# Patient Record
Sex: Female | Born: 1960 | Race: Black or African American | Hispanic: No | Smoking: Current every day smoker
Health system: Southern US, Community
[De-identification: ages and names within clinical notes are randomized; demographics above are authoritative.]

## PROBLEM LIST (undated history)

## (undated) DIAGNOSIS — F329 Major depressive disorder, single episode, unspecified: Secondary | ICD-10-CM

## (undated) DIAGNOSIS — I10 Essential (primary) hypertension: Secondary | ICD-10-CM

## (undated) DIAGNOSIS — F32A Depression, unspecified: Secondary | ICD-10-CM

## (undated) DIAGNOSIS — I252 Old myocardial infarction: Secondary | ICD-10-CM

## (undated) DIAGNOSIS — E669 Obesity, unspecified: Secondary | ICD-10-CM

## (undated) DIAGNOSIS — I2510 Atherosclerotic heart disease of native coronary artery without angina pectoris: Secondary | ICD-10-CM

## (undated) DIAGNOSIS — F419 Anxiety disorder, unspecified: Secondary | ICD-10-CM

## (undated) HISTORY — PX: KNEE SURGERY: SHX244

## (undated) HISTORY — PX: CORONARY STENT PLACEMENT: SHX1402

## (undated) HISTORY — PX: OTHER SURGICAL HISTORY: SHX169

## (undated) HISTORY — PX: BREAST SURGERY: SHX581

---

## 2000-02-24 ENCOUNTER — Inpatient Hospital Stay (HOSPITAL_COMMUNITY): Admission: EM | Admit: 2000-02-24 | Discharge: 2000-02-25 | Payer: Self-pay | Admitting: Emergency Medicine

## 2000-04-08 ENCOUNTER — Emergency Department (HOSPITAL_COMMUNITY): Admission: EM | Admit: 2000-04-08 | Discharge: 2000-04-08 | Payer: Self-pay | Admitting: *Deleted

## 2000-11-19 HISTORY — PX: ABDOMINAL HYSTERECTOMY: SHX81

## 2001-01-13 ENCOUNTER — Encounter (INDEPENDENT_AMBULATORY_CARE_PROVIDER_SITE_OTHER): Payer: Self-pay | Admitting: Specialist

## 2001-01-13 ENCOUNTER — Other Ambulatory Visit: Admission: RE | Admit: 2001-01-13 | Discharge: 2001-01-13 | Payer: Self-pay | Admitting: Plastic Surgery

## 2001-03-18 ENCOUNTER — Encounter: Payer: Self-pay | Admitting: Emergency Medicine

## 2001-03-18 ENCOUNTER — Emergency Department (HOSPITAL_COMMUNITY): Admission: EM | Admit: 2001-03-18 | Discharge: 2001-03-18 | Payer: Self-pay | Admitting: Emergency Medicine

## 2002-02-18 ENCOUNTER — Encounter: Admission: RE | Admit: 2002-02-18 | Discharge: 2002-02-18 | Payer: Self-pay | Admitting: Occupational Medicine

## 2002-03-09 ENCOUNTER — Encounter: Admission: RE | Admit: 2002-03-09 | Discharge: 2002-03-18 | Payer: Self-pay | Admitting: Occupational Medicine

## 2002-10-16 ENCOUNTER — Emergency Department (HOSPITAL_COMMUNITY): Admission: EM | Admit: 2002-10-16 | Discharge: 2002-10-16 | Payer: Self-pay | Admitting: Emergency Medicine

## 2002-10-16 ENCOUNTER — Encounter: Payer: Self-pay | Admitting: Emergency Medicine

## 2002-10-29 ENCOUNTER — Ambulatory Visit (HOSPITAL_COMMUNITY): Admission: RE | Admit: 2002-10-29 | Discharge: 2002-10-29 | Payer: Self-pay | Admitting: Obstetrics

## 2002-10-29 ENCOUNTER — Encounter: Payer: Self-pay | Admitting: Obstetrics

## 2002-11-24 ENCOUNTER — Encounter (INDEPENDENT_AMBULATORY_CARE_PROVIDER_SITE_OTHER): Payer: Self-pay | Admitting: *Deleted

## 2002-11-24 ENCOUNTER — Inpatient Hospital Stay (HOSPITAL_COMMUNITY): Admission: RE | Admit: 2002-11-24 | Discharge: 2002-11-27 | Payer: Self-pay | Admitting: Obstetrics

## 2003-02-24 ENCOUNTER — Encounter: Payer: Self-pay | Admitting: Internal Medicine

## 2003-02-24 ENCOUNTER — Encounter: Admission: RE | Admit: 2003-02-24 | Discharge: 2003-02-24 | Payer: Self-pay | Admitting: Internal Medicine

## 2004-07-18 ENCOUNTER — Emergency Department (HOSPITAL_COMMUNITY): Admission: EM | Admit: 2004-07-18 | Discharge: 2004-07-18 | Payer: Self-pay | Admitting: Emergency Medicine

## 2005-10-02 ENCOUNTER — Emergency Department (HOSPITAL_COMMUNITY): Admission: EM | Admit: 2005-10-02 | Discharge: 2005-10-03 | Payer: Self-pay | Admitting: Emergency Medicine

## 2005-11-19 HISTORY — PX: CERVICAL LAMINECTOMY: SHX94

## 2006-07-22 ENCOUNTER — Inpatient Hospital Stay (HOSPITAL_COMMUNITY): Admission: EM | Admit: 2006-07-22 | Discharge: 2006-07-25 | Payer: Self-pay | Admitting: Emergency Medicine

## 2006-08-19 ENCOUNTER — Encounter: Admission: RE | Admit: 2006-08-19 | Discharge: 2006-08-19 | Payer: Self-pay | Admitting: Orthopedic Surgery

## 2006-08-26 ENCOUNTER — Emergency Department (HOSPITAL_COMMUNITY): Admission: EM | Admit: 2006-08-26 | Discharge: 2006-08-26 | Payer: Self-pay | Admitting: Emergency Medicine

## 2006-12-12 ENCOUNTER — Emergency Department (HOSPITAL_COMMUNITY): Admission: EM | Admit: 2006-12-12 | Discharge: 2006-12-12 | Payer: Self-pay | Admitting: Emergency Medicine

## 2007-10-24 ENCOUNTER — Emergency Department (HOSPITAL_COMMUNITY): Admission: EM | Admit: 2007-10-24 | Discharge: 2007-10-25 | Payer: Self-pay | Admitting: Emergency Medicine

## 2008-01-22 IMAGING — CR DG TIBIA/FIBULA 2V*L*
3 series · 3 of 3 positions shown · non-contrast
Comparison: 07/21/06 and 07/22/06.

CLINICAL DATA: Recent ORIF for tibial fracture.  Persistent pain left tib/fib. 
 TIBIA/FIBULA TWO VIEWS:

[view not recorded (1 of 3)]
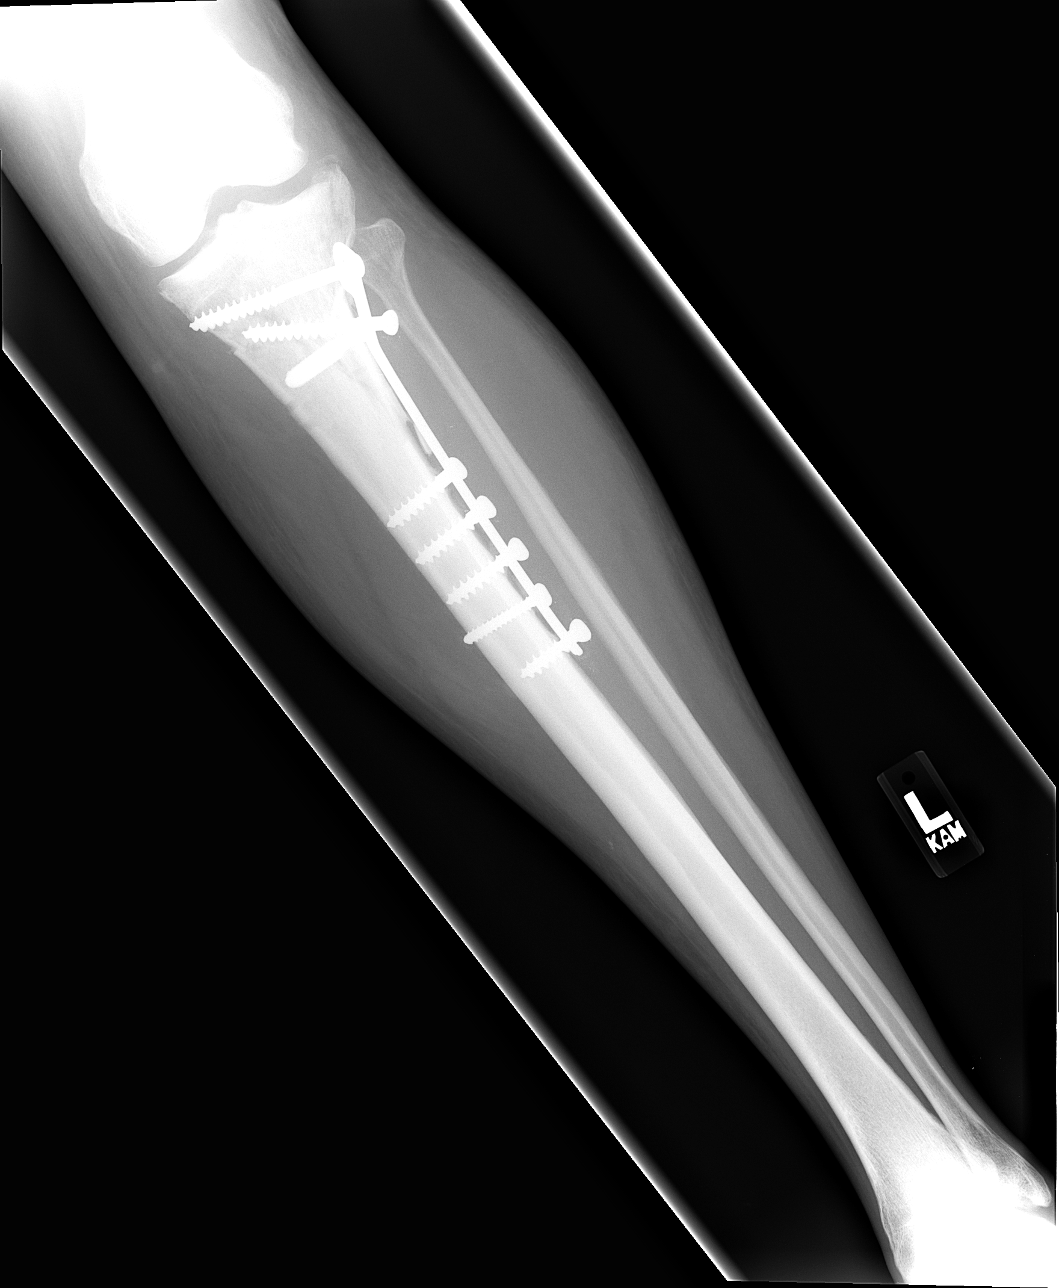

[view not recorded (2 of 3)]
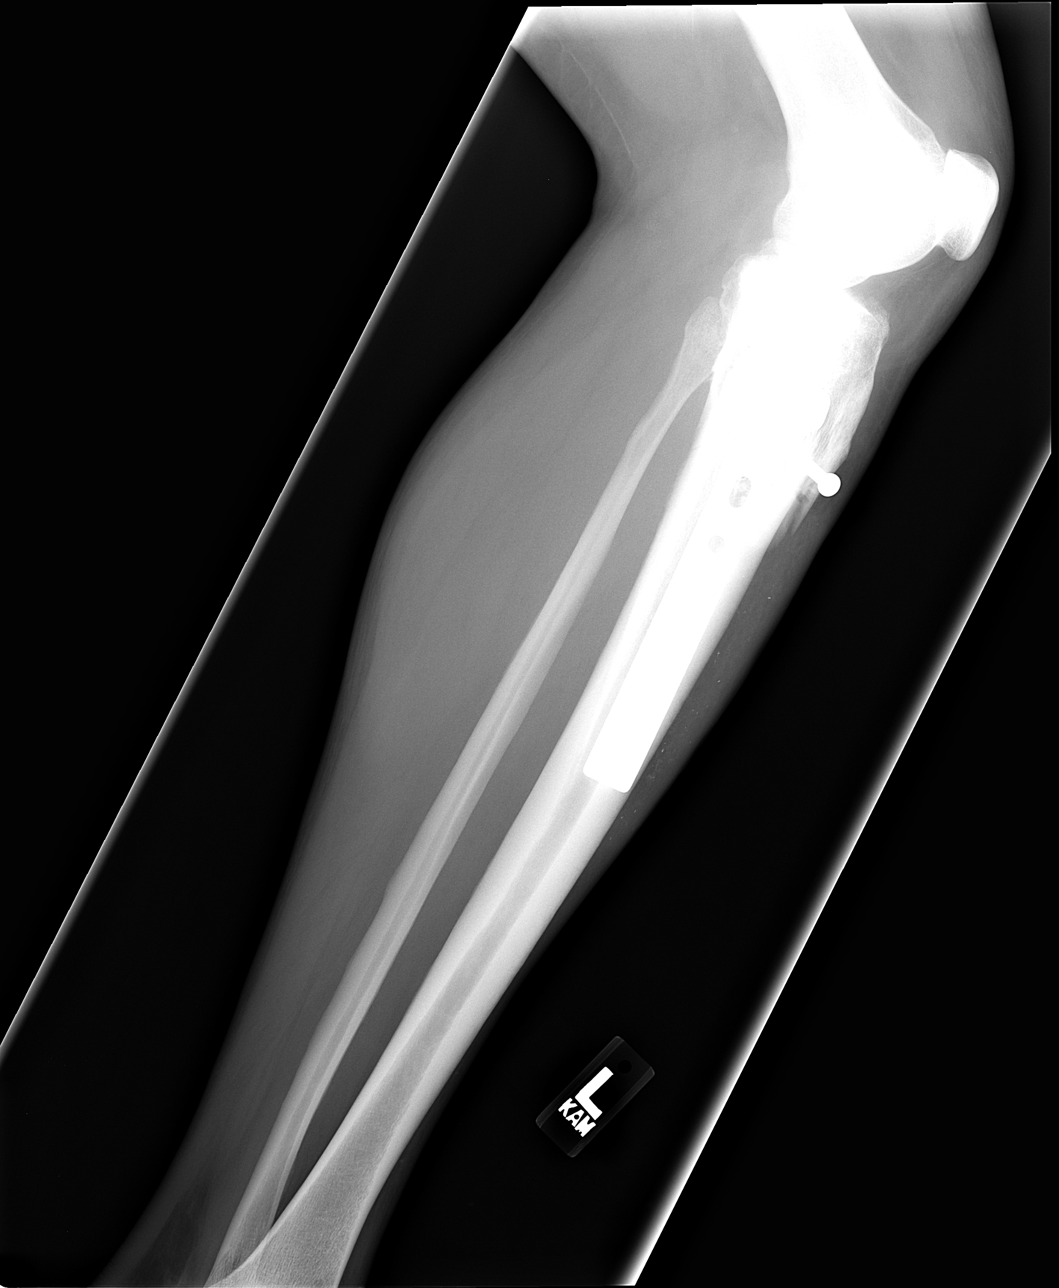

[view not recorded (3 of 3)]
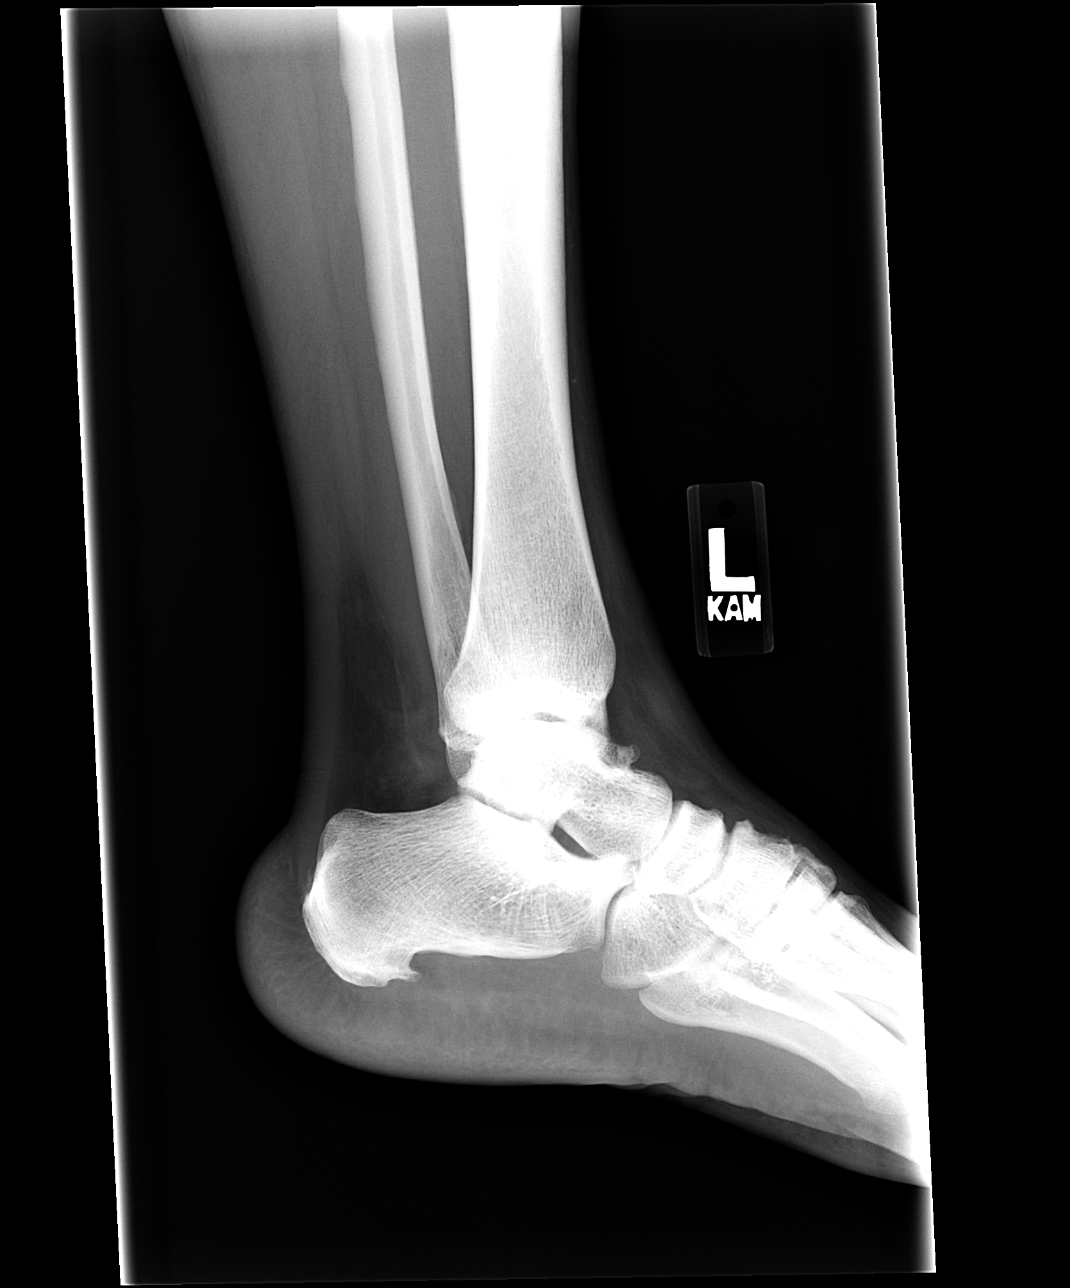

[3 of 3 positions shown; findings below may reference images not displayed]

FINDINGS: The patient has had ORIF with plate and screw fixation of a comminuted fracture of the proximal tibia and fibular head.  Overall position and alignment of the fracture fragments appears good.  Fracture line does extend to the lateral tibial plateau as demonstrated previously.
IMPRESSION: There is no radiographically detectable complication related to the surgical procedure.

## 2008-01-29 IMAGING — CR DG CHEST 2V
2 series · 2 of 2 positions shown · non-contrast
Comparison: 07/22/06

CLINICAL DATA: Cough, congestion.
 CHEST - 2 VIEW:

[w chest pa]
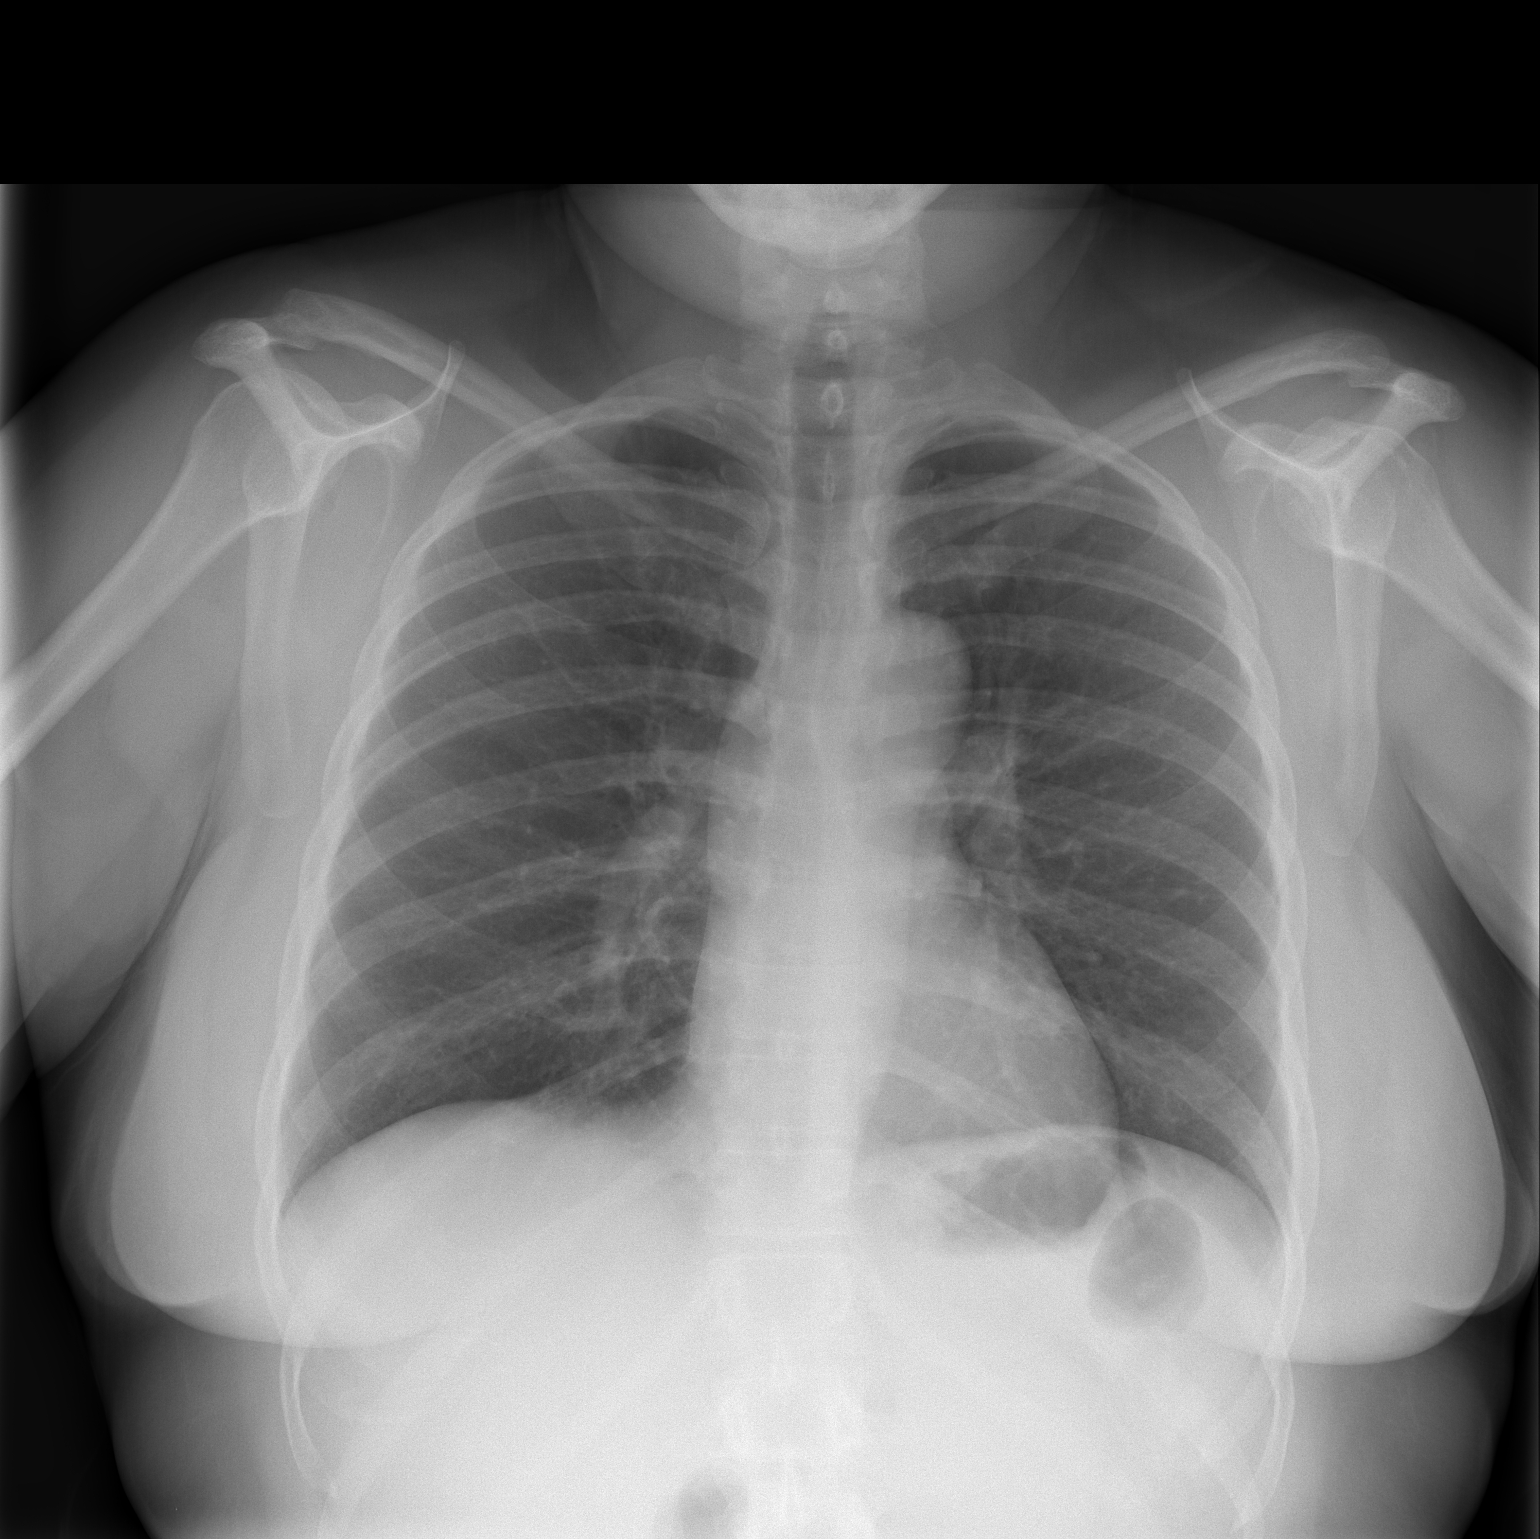

[w chest lat]
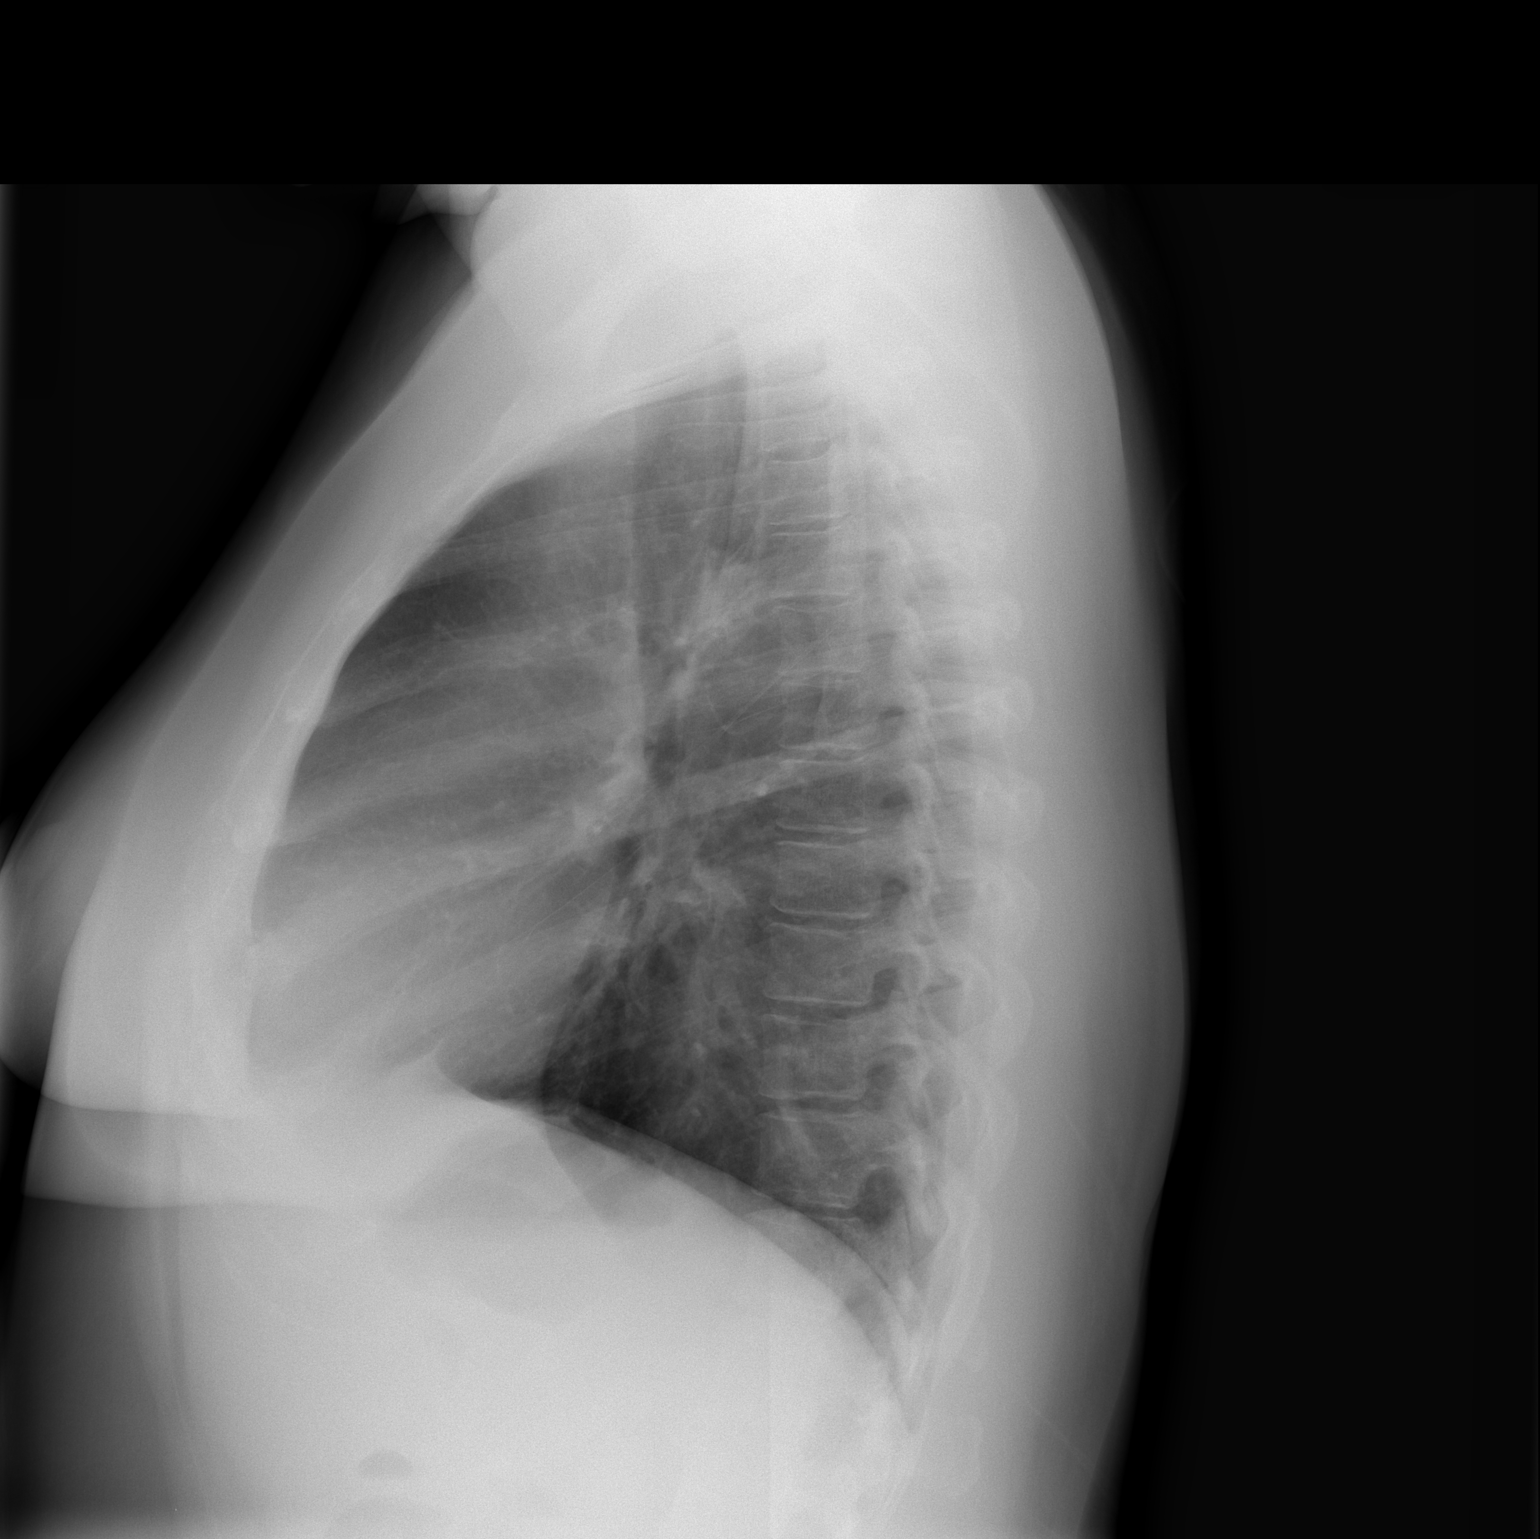

[2 of 2 positions shown; findings below may reference images not displayed]

FINDINGS: The heart size and mediastinal contours are within normal limits.  Both lungs are clear.  The visualized skeletal structures are unremarkable.
IMPRESSION: No active cardiopulmonary disease.

## 2008-10-11 ENCOUNTER — Ambulatory Visit (HOSPITAL_COMMUNITY): Admission: RE | Admit: 2008-10-11 | Discharge: 2008-10-12 | Payer: Self-pay | Admitting: Neurosurgery

## 2009-10-13 ENCOUNTER — Emergency Department (HOSPITAL_COMMUNITY): Admission: EM | Admit: 2009-10-13 | Discharge: 2009-10-13 | Payer: Self-pay | Admitting: Emergency Medicine

## 2009-11-01 ENCOUNTER — Emergency Department (HOSPITAL_COMMUNITY): Admission: EM | Admit: 2009-11-01 | Discharge: 2009-11-01 | Payer: Self-pay | Admitting: Emergency Medicine

## 2010-02-16 ENCOUNTER — Encounter: Admission: RE | Admit: 2010-02-16 | Discharge: 2010-02-16 | Payer: Self-pay | Admitting: Orthopedic Surgery

## 2010-03-15 ENCOUNTER — Ambulatory Visit (HOSPITAL_COMMUNITY): Admission: RE | Admit: 2010-03-15 | Discharge: 2010-03-15 | Payer: Self-pay | Admitting: Orthopedic Surgery

## 2010-04-14 ENCOUNTER — Encounter: Admission: RE | Admit: 2010-04-14 | Discharge: 2010-04-14 | Payer: Self-pay | Admitting: Orthopedic Surgery

## 2010-07-04 ENCOUNTER — Ambulatory Visit (HOSPITAL_COMMUNITY): Admission: RE | Admit: 2010-07-04 | Discharge: 2010-07-04 | Payer: Self-pay | Admitting: Orthopedic Surgery

## 2010-07-21 ENCOUNTER — Encounter: Admission: RE | Admit: 2010-07-21 | Discharge: 2010-08-09 | Payer: Self-pay | Admitting: Orthopedic Surgery

## 2010-12-10 ENCOUNTER — Encounter: Payer: Self-pay | Admitting: Obstetrics

## 2010-12-10 ENCOUNTER — Encounter: Payer: Self-pay | Admitting: Orthopedic Surgery

## 2011-02-01 LAB — URINALYSIS, ROUTINE W REFLEX MICROSCOPIC
Hgb urine dipstick: NEGATIVE
Leukocytes, UA: NEGATIVE
Nitrite: NEGATIVE
Protein, ur: NEGATIVE mg/dL
pH: 5.5 (ref 5.0–8.0)

## 2011-02-01 LAB — COMPREHENSIVE METABOLIC PANEL
Albumin: 3.8 g/dL (ref 3.5–5.2)
BUN: 16 mg/dL (ref 6–23)
CO2: 29 mEq/L (ref 19–32)
Chloride: 101 mEq/L (ref 96–112)
GFR calc Af Amer: >60 mL/min (ref >60)
Potassium: 4.7 mEq/L (ref 3.5–5.1)
Total Bilirubin: 0.3 mg/dL (ref 0.3–1.2)

## 2011-02-01 LAB — GLUCOSE, CAPILLARY
Glucose-Capillary: 218 mg/dL — ABNORMAL HIGH (ref 70–99)
Glucose-Capillary: 220 mg/dL — ABNORMAL HIGH (ref 70–99)

## 2011-02-01 LAB — CBC
HCT: 39.2 % (ref 36.0–46.0)
Hemoglobin: 13.3 g/dL (ref 12.0–15.0)
Platelets: 214 10*3/uL (ref 150–400)
RDW: 14 % (ref 11.5–15.5)
WBC: 6.1 10*3/uL (ref 4.0–10.5)

## 2011-02-06 LAB — COMPREHENSIVE METABOLIC PANEL
ALT: 12 U/L (ref 0–35)
AST: 20 U/L (ref 0–37)
Albumin: 4.1 g/dL (ref 3.5–5.2)
Alkaline Phosphatase: 77 U/L (ref 39–117)
BUN: 27 mg/dL — ABNORMAL HIGH (ref 6–23)
Calcium: 9.3 mg/dL (ref 8.4–10.5)
Chloride: 103 mEq/L (ref 96–112)
Creatinine, Ser: 1.07 mg/dL (ref 0.4–1.2)
GFR calc Af Amer: >60 mL/min (ref >60)
Glucose, Bld: 243 mg/dL — ABNORMAL HIGH (ref 70–99)
Total Protein: 6.8 g/dL (ref 6.0–8.3)

## 2011-02-06 LAB — GLUCOSE, CAPILLARY
Glucose-Capillary: 139 mg/dL — ABNORMAL HIGH (ref 70–99)
Glucose-Capillary: 143 mg/dL — ABNORMAL HIGH (ref 70–99)
Glucose-Capillary: 182 mg/dL — ABNORMAL HIGH (ref 70–99)

## 2011-02-06 LAB — CBC
Hemoglobin: 14.3 g/dL (ref 12.0–15.0)
MCHC: 35.6 g/dL (ref 30.0–36.0)
Platelets: 249 10*3/uL (ref 150–400)
RBC: 4.21 MIL/uL (ref 3.87–5.11)
RDW: 13.2 % (ref 11.5–15.5)

## 2011-02-06 LAB — URINALYSIS, ROUTINE W REFLEX MICROSCOPIC
Glucose, UA: 500 mg/dL — AB
Hgb urine dipstick: NEGATIVE
Nitrite: NEGATIVE
Urobilinogen, UA: 0.2 mg/dL (ref 0.0–1.0)

## 2011-02-06 LAB — URINE MICROSCOPIC-ADD ON

## 2011-04-02 ENCOUNTER — Emergency Department (HOSPITAL_COMMUNITY): Payer: Medicaid Other

## 2011-04-02 ENCOUNTER — Emergency Department (HOSPITAL_COMMUNITY)
Admission: EM | Admit: 2011-04-02 | Discharge: 2011-04-02 | Disposition: A | Discharge status: Home / Self Care | Payer: Medicaid Other | Attending: Emergency Medicine | Admitting: Emergency Medicine

## 2011-04-02 DIAGNOSIS — M25519 Pain in unspecified shoulder: Secondary | ICD-10-CM | POA: Insufficient documentation

## 2011-04-02 DIAGNOSIS — I10 Essential (primary) hypertension: Secondary | ICD-10-CM | POA: Insufficient documentation

## 2011-04-02 DIAGNOSIS — Y92009 Unspecified place in unspecified non-institutional (private) residence as the place of occurrence of the external cause: Secondary | ICD-10-CM | POA: Insufficient documentation

## 2011-04-02 DIAGNOSIS — E119 Type 2 diabetes mellitus without complications: Secondary | ICD-10-CM | POA: Insufficient documentation

## 2011-04-02 DIAGNOSIS — X500XXA Overexertion from strenuous movement or load, initial encounter: Secondary | ICD-10-CM | POA: Insufficient documentation

## 2011-04-03 NOTE — Op Note (Signed)
NAMEMADDUX, FIRST              ACCOUNT NO.:  [id number]   MEDICAL RECORD NO.:  [id number]          PATIENT TYPE:  OIB   LOCATION:  3533                         FACILITY:  [facility]   PHYSICIAN:  Clydene Fake, M.D.  DATE OF BIRTH:  [birth year]   DATE OF PROCEDURE:  10/11/2008  DATE OF DISCHARGE:                               OPERATIVE REPORT   DIAGNOSES:  Herniated nucleus pulposus at C4-C5, spondylosis, and cord  compression, and myelopathy.   POSTOPERATIVE DIAGNOSIS:  Herniated nucleus pulposus at C4-C5,  spondylosis, and cord compression, and myelopathy.   PROCEDURE:  Anterior cervical decompression, diskectomy, and fusion at  C4-C5 with LifeNet allograft bone and Trestle anterior cervical plate.   SURGEON:  Clydene Fake, MD.   ASSISTANT:  Stefani Dama, MD   ANESTHESIA:  General endotracheal tube anesthesia.   ESTIMATED BLOOD LOSS:  Minimal.   BLOOD GIVEN:  None.   DRAINS:  None.   COMPLICATIONS:  None.   REASON FOR PROCEDURE:  The patient is a 50 year old woman who has been  having neck and right arm pain and numbness with some right-sided  clumsiness on exam and have decreased sensation at right C6  distribution, which has slowed her up to have movements in the right and  MRI was done showing disk herniation, spinal changes central to the  right and to C4-C5 causing compression of spinal cord and some cord  change within the cord and foraminal narrowing to the right side.  The  patient was brought in for decompression and fusion.   PROCEDURE IN DETAIL:  The patient was brought to the operating room.  General anesthesia was induced.  The patient was placed in 10 pounds of  halter traction and prepped and draped in a sterile fashion.  A standard  incision was injected with 10 mL of 1% lidocaine with epinephrine.  An  incision was then made from the midline to the anterior border of the  sternocleidomastoid muscle on left side of the neck and incision was  taken down the left platysma and hemostasis was obtained with Bovie  cauterization.  The platysma was incised with Bovie and then blunt  dissection was taken through the anterior cervical fascia of the  anterior cervical spine.  Needle was placed in the interspace.  X-ray  was obtained showing this to be at C4-C5 interspace.  Needle was  removed.  The disk space was incised with a 15 blade and partial  diskectomy was performed.  Anterior osteophytes were removed.  Longus  coli muscles were reflected laterally on each side using the Bovie and  self-retaining retractors were placed.  Disk space was again incised  with a 15 blade and diskectomy was continued with pituitary rongeurs and  curettes.  Anterior osteophytes were removed with Kerrison punches.  Distraction pins were placed in the C4 and C5.  The interspace was  distracted and diskectomy was continued with pituitary rongeurs and  curettes.  Microscope was brought in for microdissection and 1 and 2 mm  Kerrison punches were used to remove the posterior disk osteophytes and  ligament decompressing  the central canal, spinal cord and performed  bilateral foraminotomies.  When we were finished, we had good  decompression of central canal and bilateral nerve roots.  High-speed  drill was used to remove cartilaginous endplate.  The wound was  irrigated with antibiotic solution.  We had good hemostasis.  We  measured the height of disk space to be 5 mm and a 5-mm LifeNet  allograft bone was tapped into place.  The distraction pins were  removed.  Hemostasis was obtained with Gelfoam and thrombin.  Weight was  removed the traction.  The bone plug was firmly in place and a Trestle  anterior cervical plate was placed over the anterior cervical spine and  2 screws were placed in the C4 and 2 in the C5.  These were tightened  down.  Lateral x-rays were obtained showing good position of plates,  screw, and bone plug at the C4C5 level.  Retractors  were removed.  We  had good hemostasis.  We irrigated with antibiotic solution and the  platysma was closed with 3-0 Vicryl interrupted sutures.  Subcutaneous  tissue was closed with same.  Skin was closed with benzoin and Steri-  Strips.  Dressing was placed.  The patient was placed in a soft  cervical, awakened from anesthesia, and transferred to the recovery room  in stable condition.           ______________________________  Clydene Fake, M.D.     JRH/MEDQ  D:  10/11/2008  T:  10/12/2008  Job:  [id number]

## 2011-04-06 NOTE — Discharge Summary (Signed)
[place]  Patient:    Debbie [name], Debbie [name]                 MRN: [id number] Adm. Date:  02/24/00 Disc. Date: 02/25/00 Attending:  Alvester Morin, M.D. Dictator:   Nolon Nations, M.D.                           Discharge Summary  DISCHARGE DIAGNOSES: 1. Abscess/cellulitis. 2. Diabetes mellitus.  CONSULTS:  None.  PROCEDURES:  Abscess incision and drainage.  HOSPITAL COURSE: #1 - ABSCESS/CELLUITIS:  The patient had the abscess incised and drained by the EDP first and then by Jannette Fogo, M.D., secondly.  There was expression of pus and there was irrigation with normal saline.  Surgery evaluated the patient and recommended Unasyn, overnight follow-up, and consider discharge in the morning with Augmentin.  The plan was to watch for fevers.  The patient was afebrile and did have good resolution of symptoms overnight.  She was discharged with follow-up with [facility]. Daphine Deutscher, M.D., the week of discharge with 10 days of Augmentin and follow-up with Nolon Nations, M.D., in three weeks for general care.  #2 - DIABETES:  The patient had received Glucotrol in house.  Hemoglobin A1C, fasting lipids, and CBGs were checked.  The hemoglobin A1C revealed a value of 11.3.  Her cholesterol panel showed total cholesterol 156, LDL 109, triglycerides 199, and HDL 7.  DISCHARGE MEDICATIONS: 1. Augmentin 875 mg p.o. b.i.d. x 10 days. 2. Glucotrol XL and Glucophage as previous. 3. Multivitamins one tablet p.o. q.d.  FOLLOW-UP: 1. [facility] Daphine Deutscher, M.D., general surgery.  The patient is to call for a    hospital follow-up on Monday following discharge. 2. Nolon Nations, M.D., of family practice.  To call for hospital    follow-up within three weeks.  DISCHARGE CONDITION:  Good. DD:  03/14/01 TD:  03/17/01 Job: [id number]

## 2011-04-06 NOTE — Op Note (Signed)
NAME:  Debbie [name], Debbie [name]                        ACCOUNT NO.:  [id number]   MEDICAL RECORD NO.:  [id number]                   PATIENT TYPE:  INP   LOCATION:  9323                                 FACILITY:  [facility]   PHYSICIAN:  Kathreen Cosier, M.D.           DATE OF BIRTH:  April 28, 1961   DATE OF PROCEDURE:  11/24/2002  DATE OF DISCHARGE:                                 OPERATIVE REPORT   PREOPERATIVE DIAGNOSES:  1. Myoma uteri.  2. Dysfunctional uterine bleeding.   SURGEON:  Kathreen Cosier, M.D.   FIRST ASSISTANT:  Charles A. Clearance Coots, M.D.   PROCEDURE:  Total abdominal hysterectomy and bilateral salpingectomy.   DESCRIPTION OF PROCEDURE:  The patient was placed on the operating table in  the supine position.  The abdomen was prepped and draped.  Bladder emptied  with Foley catheter.  Transverse suprapubic incision was made and carried  down to the rectus fascia.  Fascia cleaned and incised the length of the  incision.  Recti muscles were retracted laterally.  Peritoneum was entered  longitudinally.  Uterus was enlarged with multiple myomas.  Ovaries were  normal. Tubes normal.  Right round ligament was grasped with Kelly clamp,  cut and suture ligated with #1 chromic.  The procedure was done in a similar  fashion on the other side.  Using the Metzenbaum scissors, the bladder flap  was developed and dissected free of the cervix.  The right infundibulopelvic  ligament grasped with a Kelly clamp, cut and suture ligated with #1 chromic.  The procedure was done in a similar fashion on the other side.  Uterine  vessels were skeletonized bilaterally, double clamped with Heaney clamps on  the right, cut and suture ligated and done in similar fashion on the other  side.  The uterus was enlarged and was transected off of the cervix at this  point.  The cervix was grasped with Kochers and using straight Kocher clamp,  cardinal uterosacral ligaments grasped on the right, cut and suture  ligated  with #1 chromic.  The procedure weakness done in similar fashion on the  other side.  Specimen consisted of cervix removal at cervicovaginal junction  with Mayo scissors.  Modified Richardson sutures placed in the angles of the  vagina then the vaginal vault run with interlocking suture or #1 chromic.  Hemostasis was satisfactory. The right tube was grasped with Kelly clamp,  cut and suture ligated with #1 chromic.  The procedure was done in a similar  fashion on the other side.  The operative site was reperitonealized with 2-0  chromic.  Blood loss was 200 cc.  Lap and sponge counts were correct.  Abdomen closed in layers, peritoneum with continuous suture of 0 chromic,  fascia continuous suture of 0 Dexon and the skin closed with subcuticular  stitch of 3-0 Monocryl.  Kathreen Cosier, M.D.    BAM/MEDQ  D:  11/24/2002  T:  11/24/2002  Job:  [id number]

## 2011-04-06 NOTE — Op Note (Signed)
NAMELOUISE, Debbie [name]              ACCOUNT NO.:  [id number]   MEDICAL RECORD NO.:  [id number]          PATIENT TYPE:  INP   LOCATION:  1617                         FACILITY:  [facility]   PHYSICIAN:  Myrtie Neither, MD      DATE OF BIRTH:  02/05/1961   DATE OF PROCEDURE:  07/22/2006  DATE OF DISCHARGE:                                 OPERATIVE REPORT   PREOPERATIVE DIAGNOSIS:  Comminuted proximal tibial plateau and proximal  tibial fracture.   POSTOP DIAGNOSIS.:  Comminuted proximal tibial plateau and proximal tibial  fracture.   ANESTHESIA:  General.   PROCEDURE:  Open reduction internal fixation left proximal tibial fracture  with intra-articular involvement.   DESCRIPTION OF PROCEDURE:  The patient taken to operating room after given  adequate preop medications.  She was given general anesthesia and intubated.  Left lower extremity was prepped with DuraPrep and draped in sterile manner.  Tourniquet Bovie used for hemostasis.  C-arm was used to visualize fracture  reduction.  Anterior proximal curvilinear incision made along the proximal  tibial area going through the skin and subcutaneous tissue.  Sharp and blunt  dissection was made down through the fascia separating the soft tissue  subperiosteally from the fracture site. Manipulated reduction of the  fracture was done.  Two K-wires were initially used to last hold the  comminuted fragments in place.  Separate anterior fragment which was  displaced anteriorly.  This was stabilized with K-wire.  After reduction of  the fracture site an 8-hole T-plate buttress plate was used with the use of  six screws stabilizing.  Reduction was very good and was stable fixation.  Copious irrigation was done. A separate segmental screw was used anteriorly  where the anterior tibial tuberosity fragment had split off.  Further  irrigation was done. 0 Vicryl used for the fascia, 2-0 for the subcutaneous  and skin staples for skin.  Bulky compressive  dressing was applied.  Knee  immobilizer applied.  The patient tolerated the procedure quite well and  went to recovery room in stable and satisfactory position.      Myrtie Neither, MD  Electronically Signed     AC/MEDQ  D:  07/22/2006  T:  07/22/2006  Job:  [id number]

## 2011-04-06 NOTE — Discharge Summary (Signed)
Debbie [name], Debbie [name]              ACCOUNT NO.:  [id number]   MEDICAL RECORD NO.:  [id number]          PATIENT TYPE:  INP   LOCATION:  1617                         [facility]ACILITY:  [facility]   PHYSICIAN:  Myrtie Neither, MD      DATE OF BIRTH:  1961/11/09   DATE OF ADMISSION:  07/21/2006  DATE OF DISCHARGE:  07/25/2006                                 DISCHARGE SUMMARY   ADMISSION DIAGNOSES:  1. Comminuted proximal tibial fracture, left lower extremity.  2. History of diabetes mellitus.  3. History of hypertension.  4. History of asthma.   DISCHARGE DIAGNOSES:  1. Comminuted proximal tibial fracture, left lower extremity.  2. History of diabetes mellitus.  3. History of hypertension.  4. History of asthma.   COMPLICATIONS:  None.  No infections.   PROCEDURE:  Open reduction internal fixation of left proximal tibia.   HISTORY OF PRESENT ILLNESS:  This is a 50 year old female who states that  she was lifting her grandchild from the stroller when she had sat the child  down, lost her footing and landed her left knee down against the concrete  curb.  The patient was brought to [facility] Emergency Room for treatment.   PHYSICAL EXAMINATION:  Pertinent physical was down to the left lower  extremity plus effusion, tender medial and laterally and anteriorly.  Calf  is soft.  Negative Homans test.  Pulses intact.  X-rays revealed comminuted  proximal tibial fracture.   HOSPITAL COURSE:  The patient had preop laboratories with CBC, UA, EKG,  chest x-ray and CMET.  The patient's laboratory tests were stable enough to  undergo surgery.  The patient underwent ORIF of the proximal left tibia.  She tolerated the procedure quite well.  Postoperative course was fairly  benign and started on IV antibiotics and pain medications, physical therapy  nonweightbearing on the left side with use of knee immobilizer, ice packs  and elevation.  The patient's pain was brought under control.  The patient  progressed quite well to the point of being able to be discharged afebrile.  Pain and swelling was under controlled.   DISCHARGE MEDICATIONS:  Percocet one to two q.4h. p.r.n. pain.   ACTIVITY:  Elevation with ice packs.  Nonweightbearing on the left side with  use of crutches.   FOLLOW UP:  Return to the office in 1 week.   CONDITION ON DISCHARGE:  The patient is discharged in stable and  satisfactory condition.      Myrtie Neither, MD  Electronically Signed     AC/MEDQ  D:  08/02/2006  T:  08/03/2006  Job:  [id number]

## 2011-04-06 NOTE — Discharge Summary (Signed)
   NAME:  Debbie [name], Debbie [name]                        ACCOUNT NO.:  [id number]   MEDICAL RECORD NO.:  [id number]                   PATIENT TYPE:  INP   LOCATION:  9323                                 FACILITY:  [facility]   PHYSICIAN:  Kathreen Cosier, M.D.           DATE OF BIRTH:  [birth year]   DATE OF ADMISSION:  11/24/2002  DATE OF DISCHARGE:  11/27/2002                                 DISCHARGE SUMMARY   SUMMARY:  The patient is a 50 year old gravida 3 para 3 with abnormal  bleeding for the past year and myoma uteri, 16 weeks size.  She was brought  in for total abdominal hysterectomy.  On admission her hemoglobin was 8.1;  postoperative 7.1.  Platelets were 348 and 245.  PT/PTT normal.  Sodium 138,  potassium 4.3, chloride 107, creatinine 0.7, BUN 13.  Her glucose was 138 on  admission.  The patient has a history of diabetes and she takes Actos 13 mg  daily.  Postoperatively the patient did well and was discharged home on  postoperative day #3, ambulatory, on a regular diet, and Tylox one q.3-4h.,  to see me in four weeks.   DISCHARGE DIAGNOSES:  Status post total abdominal hysterectomy for myoma  uteri and dysfunctional uterine bleeding.                                               Kathreen Cosier, M.D.    BAM/MEDQ  D:  11/27/2002  T:  11/28/2002  Job:  [id number]

## 2011-04-06 NOTE — H&P (Signed)
Debbie [name], Debbie [name]              ACCOUNT NO.:  [id number]   MEDICAL RECORD NO.:  [id number]          PATIENT TYPE:  INP   LOCATION:  0103                         FACILITY:  [facility]   PHYSICIAN:  Myrtie Neither, MD      DATE OF BIRTH:  May 12, 1961   DATE OF ADMISSION:  07/21/2006  DATE OF DISCHARGE:                                HISTORY & PHYSICAL   CHIEF COMPLAINT:  Painful, swollen left knee.   HISTORY OF PRESENT ILLNESS:  This is a 50 year old black female who had a  fall today landing onto a curve while she was lifting her grandchild.  The  patient landed on the left knee and developed severe pain, swelling, and  deformity of the left knee.  The patient was brought to [facility] emergency room for treatment.  The patient denies any  trauma to the head, but does state that she bruised the left elbow also.   PAST MEDICAL HISTORY:  Diabetes mellitus, high blood pressure, and  depression.  Previous history of right knee surgery.   ALLERGIES:  No known drug allergies.   MEDICATIONS:  1. Xanax.  2. Antidepressant.  3. Antihypertensive.  4. Diabetic oral medication which she does not know and does not have with      her.   FAMILY HISTORY:  High blood pressure, diabetes in both parents as well as  cancer history.   SOCIAL HISTORY:  The patient does smoke one pack per day and occasional use  of alcohol.   REVIEW OF SYSTEMS:  No cardiac or respiratory and no urinary or bowel  symptoms.   PHYSICAL EXAMINATION:  VITAL SIGNS:  Temperature 98.1, blood pressure  183/94, pulse 88, respirations 22.  HEENT:  Head normocephalic.  Eyes; conjunctivae and sclerae clear.  NECK:  Supple.  CHEST:  Clear.  HEART:  S1 and S2 regular.  ABDOMEN:  Soft, active bowel sounds.  EXTREMITIES:  Left knee +2 effusion, tender medially, laterally, and  anteriorly.  Calf muscle is soft.  Negative Homan's test.  Pulses in the  left lower extremity is intact.  The patient did have  movement of the toes.  Sensory is intact.   X-ray reveal a comminuted proximal tibial intra-articular fracture.   IMPRESSION:  Severely comminuted proximal tibial intra-articular fracture,  left knee.   PLAN:  Open reduction and internal fixation of proximal tibial fracture.      Myrtie Neither, MD  Electronically Signed     AC/MEDQ  D:  07/22/2006  T:  07/22/2006  Job:  [id number]

## 2011-06-11 ENCOUNTER — Ambulatory Visit: Payer: Medicare Other | Admitting: Physical Medicine & Rehabilitation

## 2011-06-11 ENCOUNTER — Encounter: Payer: Medicare Other | Attending: Physical Medicine & Rehabilitation

## 2011-06-11 DIAGNOSIS — M545 Low back pain, unspecified: Secondary | ICD-10-CM | POA: Insufficient documentation

## 2011-06-11 DIAGNOSIS — R209 Unspecified disturbances of skin sensation: Secondary | ICD-10-CM | POA: Insufficient documentation

## 2011-06-11 DIAGNOSIS — M5126 Other intervertebral disc displacement, lumbar region: Secondary | ICD-10-CM | POA: Insufficient documentation

## 2011-06-11 DIAGNOSIS — M961 Postlaminectomy syndrome, not elsewhere classified: Secondary | ICD-10-CM | POA: Insufficient documentation

## 2011-06-11 DIAGNOSIS — M47817 Spondylosis without myelopathy or radiculopathy, lumbosacral region: Secondary | ICD-10-CM

## 2011-06-11 DIAGNOSIS — M542 Cervicalgia: Secondary | ICD-10-CM | POA: Insufficient documentation

## 2011-06-11 DIAGNOSIS — M4712 Other spondylosis with myelopathy, cervical region: Secondary | ICD-10-CM | POA: Insufficient documentation

## 2011-06-11 DIAGNOSIS — M4802 Spinal stenosis, cervical region: Secondary | ICD-10-CM | POA: Insufficient documentation

## 2011-06-11 NOTE — Progress Notes (Signed)
CHIEF COMPLAINT:  Back pain.  A 50 year old female with history of cervical myelopathy and stenosis has had previous surgery per Dr. Colon Branch in 2009, who performed ACDF at C4-5 level.  In addition, she has had problems with what sounds to be recurrent shoulder dislocation and in the last time she had a "a pinched nerve related to this."  She has been seen by Dr. Myrtie Neither as well.  Her primary care physician is Dr. Greggory Stallion Osei-Bonsu.  She has had some lower extremity pain.  She has had ankle brachial indexes performed which was 0.81 on the right and 0.91 on the left.  She has had chronic low back pain.  Her last MRI of the lumbar spine was in 2004, demonstrating L4-5 stenosis graded as moderate centrally, but no foraminal stenosis.  She had facet arthrosis bilaterally L4-5.  She had disk protrusion at L5-S1 and no significant central stenosis at that level.  She has had a history of a complex tibial fracture, status post ORIF several years ago and has more recently approximately 1 year ago had removal of hardware per Dr. Rossie Muskrat.  Her medications include lisinopril/hydrochlorothiazide, Norvasc, Pravachol, Celexa, Metaglip, Klonopin, hydrocodone which she takes 10 mg approximately 3 times a day, fluticasone, etodolac and metaxalone.  HABITS:  No smoking.  No alcohol abuse.  She "smokes some reefer or puts it in the tea bag and drinks it."  She has been on Neurontin in the past.  OTHER PAST HISTORY:  She is diabetic, has some numbness in the feet.  REVIEW OF SYSTEMS:  Otherwise negative.  Her pain scores are in the moderate-to-severe range around 8/10.  She is functionally independent.  PHYSICAL EXAMINATION:  In general, no acute distress.  Mood and affect appropriate.  Her neck range of motion is 75% forward flexion, extension, lateral rotation and bending.  Upper extremity strength is 5/5 bilateral deltoid, biceps, triceps grip as well as lower extremity hip  flexor, knee extensor and ankle dorsiflexor.  Sensation is mildly reduced to left thumb compared to the right side.  She has no hand intrinsic atrophy.  In the lower extremities, she has a decrease in pinprick sensation in a stocking pattern.  Her gait shows no evidence of toe drag or knee instability.  She has negative Tinel at the elbows and wrists bilaterally.  Range of motion in the upper and lower extremities are normal.  Lumbar range of motion, however, is about 50% forward flexion, extension, lateral rotation and bending.  She states that both forward flexion and extension heard equally.  IMPRESSION: 1. 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 2. Low back pain, has a history of lumbar spinal stenosis seen graded     as moderate in 2004 likely progressive.  She does have lumbar     spondylosis as well that maybe accounting for some of her pain. 3. Left thumb numbness.  This can be due to brachial plexopathy from     dislocation, but other potential etiologies exist such as cervical     radiculopathy or even a peripheral compression neuropathy.  PLAN: 1. We will get her set up for medial branch blocks. 2. We discussed narcotic analgesics and that it THC shows up on her     urine drug screen.  I would not be prescribing these.  She is okay     with this per her report. 3. To start Neurontin 100 mg working up to q.i.d. over the course of  the next 2 weeks. 4. We went over spine model with the patient discussing on medial     branch blocks, answered questions.     Erick Colace, M.D. Electronically Signed    AEK/MedQ D:  06/11/2011 11:35:09  T:  06/11/2011 23:02:00  Job #:  [id number]  cc:   Jackie Plum, M.D. Fax: [phone]

## 2011-07-09 ENCOUNTER — Encounter: Payer: Medicare Other | Attending: Physical Medicine & Rehabilitation

## 2011-07-09 ENCOUNTER — Ambulatory Visit (HOSPITAL_BASED_OUTPATIENT_CLINIC_OR_DEPARTMENT_OTHER)
Admission: RE | Admit: 2011-07-09 | Discharge: 2011-07-09 | Disposition: A | Discharge status: Home / Self Care | Payer: Medicare Other | Source: Ambulatory Visit | Attending: Physical Medicine & Rehabilitation | Admitting: Physical Medicine & Rehabilitation

## 2011-07-09 ENCOUNTER — Encounter: Payer: Medicare Other | Admitting: Physical Medicine & Rehabilitation

## 2011-07-09 DIAGNOSIS — M47817 Spondylosis without myelopathy or radiculopathy, lumbosacral region: Secondary | ICD-10-CM

## 2011-07-09 DIAGNOSIS — M4712 Other spondylosis with myelopathy, cervical region: Secondary | ICD-10-CM | POA: Insufficient documentation

## 2011-07-09 DIAGNOSIS — M545 Low back pain, unspecified: Secondary | ICD-10-CM | POA: Insufficient documentation

## 2011-07-09 DIAGNOSIS — R209 Unspecified disturbances of skin sensation: Secondary | ICD-10-CM | POA: Insufficient documentation

## 2011-07-09 DIAGNOSIS — M4802 Spinal stenosis, cervical region: Secondary | ICD-10-CM | POA: Insufficient documentation

## 2011-07-09 DIAGNOSIS — M961 Postlaminectomy syndrome, not elsewhere classified: Secondary | ICD-10-CM | POA: Insufficient documentation

## 2011-07-09 DIAGNOSIS — M5126 Other intervertebral disc displacement, lumbar region: Secondary | ICD-10-CM | POA: Insufficient documentation

## 2011-07-09 DIAGNOSIS — M542 Cervicalgia: Secondary | ICD-10-CM | POA: Insufficient documentation

## 2011-07-09 NOTE — Procedures (Signed)
[name], [name]              ACCOUNT NO.:  [id number]  MEDICAL RECORD NO.:  [id number]           PATIENT TYPE:  O  LOCATION:  [facility]                          FACILITY:  [facility]  PHYSICIAN:  Erick Colace, M.D.DATE OF BIRTH:  04-23-1961  DATE OF PROCEDURE:  07/09/2011 DATE OF DISCHARGE:                              OPERATIVE REPORT  PROCEDURE:  Bilateral L5 dorsal ramus injection, bilateral L4 and bilateral L3 medial branch blocks under fluoroscopic guidance.  INDICATION:  Lumbar pain, lumbar spondylosis.  Informed consent was obtained after describing risks and benefits of the procedure with the patient.  These include bleeding, bruising and infection.  She elects to proceed and has given written consent.  The patient placed prone on fluoroscopy table.  Betadine prep, sterile drape, time-out taken to ensure proper procedure.  It is bilateral.  A 25-gauge inch and half needle was used to anesthetize the skin and subcu tissue 1% lidocaine 1 mL at each of 6 sites and a 22-gauge, 3-1/2- inch spinal needle was inserted under fluoroscopic guidance starting first left S1 SAP sacroiliac junction, bone contact made.  Omnipaque 180 x 0.5 mL demonstrated no intravascular uptake.  Then, 0.5 mL of a solution containing 1 mL of 4 mg/mL of dexamethasone, 2 mL of 2% MPF lidocaine was injected in the left L5 SAP transverse process junction targeted, bone contact made.  Omnipaque 180 x 0.5 mL demonstrated no intravascular uptake and 0.5 mL of the dexamethasone lidocaine solution was injected.  Then, a left L4 SAP transverse process junction targeted, bone contact made.  Omnipaque 180 x 0.5 mL demonstrated no intravascular uptake.  Then, 0.5 mL of the dexamethasone lidocaine solution was injected.  The same procedure was repeated on the right side using same needle injectate and technique at corresponding levels.  The patient tolerated the procedure well.  Postprocedure instructions  given.     Erick Colace, M.D. Electronically Signed    AEK/MEDQ  D:  07/09/2011 14:34:07  T:  07/09/2011 22:56:01  Job:  [id number]  cc:   Jackie Plum, M.D. Fax: [phone]

## 2011-08-20 ENCOUNTER — Ambulatory Visit: Payer: Medicare Other | Admitting: Physical Medicine & Rehabilitation

## 2011-08-20 ENCOUNTER — Encounter: Payer: Medicare Other | Attending: Physical Medicine & Rehabilitation

## 2011-08-20 DIAGNOSIS — M961 Postlaminectomy syndrome, not elsewhere classified: Secondary | ICD-10-CM | POA: Insufficient documentation

## 2011-08-20 DIAGNOSIS — M542 Cervicalgia: Secondary | ICD-10-CM | POA: Insufficient documentation

## 2011-08-20 DIAGNOSIS — M4802 Spinal stenosis, cervical region: Secondary | ICD-10-CM | POA: Insufficient documentation

## 2011-08-20 DIAGNOSIS — R209 Unspecified disturbances of skin sensation: Secondary | ICD-10-CM | POA: Insufficient documentation

## 2011-08-20 DIAGNOSIS — M5126 Other intervertebral disc displacement, lumbar region: Secondary | ICD-10-CM | POA: Insufficient documentation

## 2011-08-20 DIAGNOSIS — M545 Low back pain, unspecified: Secondary | ICD-10-CM | POA: Insufficient documentation

## 2011-08-20 DIAGNOSIS — M4712 Other spondylosis with myelopathy, cervical region: Secondary | ICD-10-CM | POA: Insufficient documentation

## 2011-08-21 LAB — BASIC METABOLIC PANEL
BUN: 12
CO2: 24
Calcium: 8.8
GFR calc non Af Amer: >60
Glucose, Bld: 181 — ABNORMAL HIGH

## 2011-08-21 LAB — GLUCOSE, CAPILLARY
Glucose-Capillary: 176 — ABNORMAL HIGH
Glucose-Capillary: 202 — ABNORMAL HIGH
Glucose-Capillary: 218 — ABNORMAL HIGH
Glucose-Capillary: 275 — ABNORMAL HIGH
Glucose-Capillary: 279 — ABNORMAL HIGH

## 2011-08-21 LAB — CBC
MCHC: 33.8
Platelets: 227
RDW: 12.9

## 2011-08-21 LAB — PROTIME-INR
INR: 0.9
Prothrombin Time: 12.7

## 2011-08-21 LAB — URINALYSIS, ROUTINE W REFLEX MICROSCOPIC
Bilirubin Urine: NEGATIVE
Glucose, UA: NEGATIVE
Hgb urine dipstick: NEGATIVE
Protein, ur: NEGATIVE
Specific Gravity, Urine: 1.015
Urobilinogen, UA: 1

## 2011-08-27 LAB — BASIC METABOLIC PANEL
CO2: 26
Calcium: 9.3
Creatinine, Ser: 0.7
GFR calc Af Amer: >60
GFR calc non Af Amer: >60
Sodium: 137

## 2011-08-27 LAB — DIFFERENTIAL
Basophils Relative: 2 — ABNORMAL HIGH
Lymphocytes Relative: 39
Monocytes Relative: 10
Neutro Abs: 2.9
Neutrophils Relative %: 48

## 2011-08-27 LAB — CBC
Hemoglobin: 14.4
MCHC: 35.4
RBC: 4.35
WBC: 6

## 2011-08-27 LAB — URINALYSIS, ROUTINE W REFLEX MICROSCOPIC
Glucose, UA: >1000 — AB
Hgb urine dipstick: NEGATIVE
Specific Gravity, Urine: 1.045 — ABNORMAL HIGH
pH: 5.5

## 2011-08-27 LAB — URINE MICROSCOPIC-ADD ON

## 2012-01-15 ENCOUNTER — Inpatient Hospital Stay (HOSPITAL_COMMUNITY)
Admission: EM | Admit: 2012-01-15 | Discharge: 2012-01-17 | DRG: 247 | Disposition: A | Discharge status: Home / Self Care | Payer: Medicare Other | Source: Ambulatory Visit | Attending: Interventional Cardiology | Admitting: Interventional Cardiology

## 2012-01-15 ENCOUNTER — Encounter (HOSPITAL_COMMUNITY)
Admission: EM | Disposition: A | Discharge status: Home / Self Care | Payer: Self-pay | Source: Ambulatory Visit | Attending: Interventional Cardiology

## 2012-01-15 ENCOUNTER — Other Ambulatory Visit: Payer: Self-pay

## 2012-01-15 ENCOUNTER — Ambulatory Visit (HOSPITAL_COMMUNITY): Admit: 2012-01-15 | Payer: Self-pay | Admitting: Interventional Cardiology

## 2012-01-15 ENCOUNTER — Encounter (HOSPITAL_COMMUNITY): Payer: Self-pay | Admitting: Emergency Medicine

## 2012-01-15 DIAGNOSIS — E119 Type 2 diabetes mellitus without complications: Secondary | ICD-10-CM | POA: Diagnosis present

## 2012-01-15 DIAGNOSIS — R079 Chest pain, unspecified: Secondary | ICD-10-CM

## 2012-01-15 DIAGNOSIS — I2510 Atherosclerotic heart disease of native coronary artery without angina pectoris: Secondary | ICD-10-CM | POA: Diagnosis present

## 2012-01-15 DIAGNOSIS — I2109 ST elevation (STEMI) myocardial infarction involving other coronary artery of anterior wall: Principal | ICD-10-CM | POA: Diagnosis present

## 2012-01-15 DIAGNOSIS — F17200 Nicotine dependence, unspecified, uncomplicated: Secondary | ICD-10-CM | POA: Diagnosis present

## 2012-01-15 DIAGNOSIS — Z91199 Patient's noncompliance with other medical treatment and regimen due to unspecified reason: Secondary | ICD-10-CM

## 2012-01-15 DIAGNOSIS — Z9119 Patient's noncompliance with other medical treatment and regimen: Secondary | ICD-10-CM

## 2012-01-15 DIAGNOSIS — I213 ST elevation (STEMI) myocardial infarction of unspecified site: Secondary | ICD-10-CM | POA: Diagnosis present

## 2012-01-15 DIAGNOSIS — E1159 Type 2 diabetes mellitus with other circulatory complications: Secondary | ICD-10-CM | POA: Diagnosis present

## 2012-01-15 DIAGNOSIS — I10 Essential (primary) hypertension: Secondary | ICD-10-CM | POA: Diagnosis present

## 2012-01-15 HISTORY — PX: LEFT HEART CATHETERIZATION WITH CORONARY ANGIOGRAM: SHX5451

## 2012-01-15 HISTORY — DX: Anxiety disorder, unspecified: F41.9

## 2012-01-15 HISTORY — DX: Essential (primary) hypertension: I10

## 2012-01-15 HISTORY — DX: Major depressive disorder, single episode, unspecified: F32.9

## 2012-01-15 HISTORY — PX: PERCUTANEOUS CORONARY STENT INTERVENTION (PCI-S): SHX5485

## 2012-01-15 HISTORY — DX: Depression, unspecified: F32.A

## 2012-01-15 LAB — BASIC METABOLIC PANEL
BUN: 11 mg/dL (ref 6–23)
BUN: 11 mg/dL (ref 6–23)
CO2: 22 mEq/L (ref 19–32)
CO2: 25 mEq/L (ref 19–32)
Calcium: 10.1 mg/dL (ref 8.4–10.5)
Calcium: 10.9 mg/dL — ABNORMAL HIGH (ref 8.4–10.5)
Chloride: 91 mEq/L — ABNORMAL LOW (ref 96–112)
Chloride: 94 mEq/L — ABNORMAL LOW (ref 96–112)
Creatinine, Ser: 0.63 mg/dL (ref 0.50–1.10)
Creatinine, Ser: 0.75 mg/dL (ref 0.50–1.10)
Creatinine, Ser: 0.89 mg/dL (ref 0.50–1.10)
GFR calc Af Amer: >90 mL/min (ref >90)
GFR calc Af Amer: >90 mL/min (ref >90)
GFR calc non Af Amer: >90 mL/min (ref >90)
Glucose, Bld: 414 mg/dL — ABNORMAL HIGH (ref 70–99)
Potassium: 3.7 mEq/L (ref 3.5–5.1)
Sodium: 132 mEq/L — ABNORMAL LOW (ref 135–145)

## 2012-01-15 LAB — CBC
HCT: 40.7 % (ref 36.0–46.0)
HCT: 41.3 % (ref 36.0–46.0)
Hemoglobin: 14.1 g/dL (ref 12.0–15.0)
MCH: 33.3 pg (ref 26.0–34.0)
MCHC: 34.6 g/dL (ref 30.0–36.0)
MCHC: 34.9 g/dL (ref 30.0–36.0)
MCV: 94.4 fL (ref 78.0–100.0)
MCV: 95.6 fL (ref 78.0–100.0)
Platelets: 234 10*3/uL (ref 150–400)
RDW: 12.8 % (ref 11.5–15.5)
RDW: 12.9 % (ref 11.5–15.5)
WBC: 10 10*3/uL (ref 4.0–10.5)

## 2012-01-15 LAB — POCT I-STAT, CHEM 8
BUN: 11 mg/dL (ref 6–23)
Calcium, Ion: 1.16 mmol/L (ref 1.12–1.32)
Calcium, Ion: 1.23 mmol/L (ref 1.12–1.32)
Creatinine, Ser: 0.9 mg/dL (ref 0.50–1.10)
Glucose, Bld: 405 mg/dL — ABNORMAL HIGH (ref 70–99)
Glucose, Bld: 506 mg/dL — ABNORMAL HIGH (ref 70–99)
HCT: 43 % (ref 36.0–46.0)
HCT: 44 % (ref 36.0–46.0)
Hemoglobin: 15 g/dL (ref 12.0–15.0)
Potassium: 3.6 mEq/L (ref 3.5–5.1)
TCO2: 24 mmol/L (ref 0–100)

## 2012-01-15 LAB — APTT: aPTT: <20 seconds — ABNORMAL LOW (ref 24–37)

## 2012-01-15 LAB — CARDIAC PANEL(CRET KIN+CKTOT+MB+TROPI)
CK, MB: 62.6 ng/mL (ref 0.3–4.0)
CK, MB: 119.8 ng/mL (ref 0.3–4.0)
CK, MB: 63.4 ng/mL (ref 0.3–4.0)
Relative Index: 6.1 — ABNORMAL HIGH (ref 0.0–2.5)
Total CK: 1970 U/L — ABNORMAL HIGH (ref 7–177)
Total CK: 1266 U/L — ABNORMAL HIGH (ref 7–177)
Troponin I: >25 ng/mL (ref <0.30)

## 2012-01-15 LAB — POCT I-STAT TROPONIN I: Troponin i, poc: 0.02 ng/mL (ref 0.00–0.08)

## 2012-01-15 LAB — LIPID PANEL
LDL Cholesterol: UNDETERMINED mg/dL (ref 0–99)
Triglycerides: 521 mg/dL — ABNORMAL HIGH (ref <150)
VLDL: UNDETERMINED mg/dL (ref 0–40)

## 2012-01-15 LAB — GLUCOSE, CAPILLARY
Glucose-Capillary: 248 mg/dL — ABNORMAL HIGH (ref 70–99)
Glucose-Capillary: 297 mg/dL — ABNORMAL HIGH (ref 70–99)
Glucose-Capillary: 333 mg/dL — ABNORMAL HIGH (ref 70–99)

## 2012-01-15 LAB — DIFFERENTIAL
Basophils Absolute: 0.1 10*3/uL (ref 0.0–0.1)
Basophils Relative: 1 % (ref 0–1)
Eosinophils Relative: 1 % (ref 0–5)
Monocytes Absolute: 0.7 10*3/uL (ref 0.1–1.0)
Monocytes Relative: 8 % (ref 3–12)

## 2012-01-15 LAB — HEMOGLOBIN A1C: Hgb A1c MFr Bld: 11.5 % — ABNORMAL HIGH (ref <5.7)

## 2012-01-15 LAB — POCT ACTIVATED CLOTTING TIME: Activated Clotting Time: 628 seconds

## 2012-01-15 LAB — PROTIME-INR
INR: 0.85 (ref 0.00–1.49)
Prothrombin Time: 11.8 seconds (ref 11.6–15.2)

## 2012-01-15 SURGERY — LEFT HEART CATHETERIZATION WITH CORONARY ANGIOGRAM
Anesthesia: LOCAL

## 2012-01-15 MED ORDER — TICAGRELOR 90 MG PO TABS
ORAL_TABLET | ORAL | Status: AC
  Filled 2012-01-15: qty 2

## 2012-01-15 MED ORDER — ASPIRIN 81 MG PO CHEW
324.0000 mg | CHEWABLE_TABLET | Freq: Once | ORAL | Status: AC
  Administered 2012-01-15: 324 mg via ORAL

## 2012-01-15 MED ORDER — METOPROLOL TARTRATE 12.5 MG HALF TABLET
12.5000 mg | ORAL_TABLET | Freq: Two times a day (BID) | ORAL | Status: DC
  Administered 2012-01-15 – 2012-01-17 (×5): 12.5 mg via ORAL
  Filled 2012-01-15 (×6): qty 1

## 2012-01-15 MED ORDER — INSULIN ASPART 100 UNIT/ML ~~LOC~~ SOLN: 0.0000 [IU] | Freq: Every day | SUBCUTANEOUS | Status: DC

## 2012-01-15 MED ORDER — ASPIRIN 300 MG RE SUPP: 300.0000 mg | RECTAL | Status: DC

## 2012-01-15 MED ORDER — PRASUGREL HCL 10 MG PO TABS
10.0000 mg | ORAL_TABLET | Freq: Every day | ORAL | Status: DC
  Administered 2012-01-15 – 2012-01-17 (×3): 10 mg via ORAL
  Filled 2012-01-15 (×3): qty 1

## 2012-01-15 MED ORDER — HEPARIN BOLUS VIA INFUSION
4000.0000 [IU] | Freq: Once | INTRAVENOUS | Status: AC
  Administered 2012-01-15: 4000 [IU] via INTRAVENOUS

## 2012-01-15 MED ORDER — HEPARIN SODIUM (PORCINE) 5000 UNIT/ML IJ SOLN
5000.0000 [IU] | Freq: Three times a day (TID) | INTRAMUSCULAR | Status: DC
  Administered 2012-01-15 – 2012-01-17 (×6): 5000 [IU] via SUBCUTANEOUS
  Filled 2012-01-15 (×10): qty 1

## 2012-01-15 MED ORDER — INSULIN ASPART 100 UNIT/ML ~~LOC~~ SOLN
0.0000 [IU] | Freq: Every day | SUBCUTANEOUS | Status: DC
  Administered 2012-01-15 – 2012-01-16 (×2): 4 [IU] via SUBCUTANEOUS

## 2012-01-15 MED ORDER — ATORVASTATIN CALCIUM 10 MG PO TABS
20.0000 mg | ORAL_TABLET | Freq: Every day | ORAL | Status: DC
  Administered 2012-01-15 – 2012-01-16 (×2): 20 mg via ORAL
  Filled 2012-01-15 (×3): qty 2

## 2012-01-15 MED ORDER — BIVALIRUDIN 250 MG IV SOLR
INTRAVENOUS | Status: AC
  Filled 2012-01-15: qty 250

## 2012-01-15 MED ORDER — ACETAMINOPHEN 325 MG PO TABS
650.0000 mg | ORAL_TABLET | ORAL | Status: DC | PRN
  Administered 2012-01-15: 650 mg via ORAL
  Filled 2012-01-15: qty 2

## 2012-01-15 MED ORDER — ASPIRIN EC 81 MG PO TBEC: 81.0000 mg | DELAYED_RELEASE_TABLET | Freq: Every day | ORAL | Status: DC

## 2012-01-15 MED ORDER — ACETAMINOPHEN 325 MG PO TABS: 650.0000 mg | ORAL_TABLET | ORAL | Status: DC | PRN

## 2012-01-15 MED ORDER — ASPIRIN 81 MG PO CHEW: 324.0000 mg | CHEWABLE_TABLET | ORAL | Status: DC

## 2012-01-15 MED ORDER — ONDANSETRON HCL 4 MG/2ML IJ SOLN: 4.0000 mg | Freq: Four times a day (QID) | INTRAMUSCULAR | Status: DC | PRN

## 2012-01-15 MED ORDER — INSULIN ASPART 100 UNIT/ML ~~LOC~~ SOLN
0.0000 [IU] | Freq: Three times a day (TID) | SUBCUTANEOUS | Status: DC
Start: 2012-01-15 — End: 2012-01-15

## 2012-01-15 MED ORDER — HEPARIN (PORCINE) IN NACL 2-0.9 UNIT/ML-% IJ SOLN
INTRAMUSCULAR | Status: AC
  Filled 2012-01-15: qty 2000

## 2012-01-15 MED ORDER — INSULIN ASPART 100 UNIT/ML ~~LOC~~ SOLN
4.0000 [IU] | Freq: Three times a day (TID) | SUBCUTANEOUS | Status: DC
  Administered 2012-01-15 (×3): 4 [IU] via SUBCUTANEOUS

## 2012-01-15 MED ORDER — LIDOCAINE HCL (PF) 1 % IJ SOLN
INTRAMUSCULAR | Status: AC
  Filled 2012-01-15: qty 30

## 2012-01-15 MED ORDER — INSULIN ASPART 100 UNIT/ML ~~LOC~~ SOLN
0.0000 [IU] | Freq: Three times a day (TID) | SUBCUTANEOUS | Status: DC
Start: 2012-01-16 — End: 2012-01-17
  Administered 2012-01-16: 20 [IU] via SUBCUTANEOUS
  Administered 2012-01-16: 11 [IU] via SUBCUTANEOUS
  Administered 2012-01-16: 4 [IU] via SUBCUTANEOUS
  Administered 2012-01-17: 15 [IU] via SUBCUTANEOUS
  Administered 2012-01-17: 11 [IU] via SUBCUTANEOUS
  Filled 2012-01-15: qty 3

## 2012-01-15 MED ORDER — ACTIVE PARTNERSHIP FOR HEALTH OF YOUR HEART BOOK
Freq: Once | Status: AC
  Administered 2012-01-16: 06:00:00
  Filled 2012-01-15: qty 1

## 2012-01-15 MED ORDER — MIDAZOLAM HCL 2 MG/2ML IJ SOLN
INTRAMUSCULAR | Status: AC
  Filled 2012-01-15: qty 2

## 2012-01-15 MED ORDER — FENTANYL CITRATE 0.05 MG/ML IJ SOLN
INTRAMUSCULAR | Status: AC
  Filled 2012-01-15: qty 2

## 2012-01-15 MED ORDER — INSULIN ASPART 100 UNIT/ML ~~LOC~~ SOLN: 0.0000 [IU] | Freq: Three times a day (TID) | SUBCUTANEOUS | Status: DC

## 2012-01-15 MED ORDER — SODIUM CHLORIDE 0.9 % IV SOLN
1.0000 mL/kg/h | INTRAVENOUS | Status: AC
  Administered 2012-01-15: 1 mL/kg/h via INTRAVENOUS

## 2012-01-15 MED ORDER — MORPHINE SULFATE 2 MG/ML IJ SOLN
1.0000 mg | INTRAMUSCULAR | Status: DC | PRN
  Administered 2012-01-15: 1 mg via INTRAVENOUS
  Filled 2012-01-15: qty 1

## 2012-01-15 MED ORDER — INSULIN ASPART 100 UNIT/ML ~~LOC~~ SOLN
4.0000 [IU] | Freq: Three times a day (TID) | SUBCUTANEOUS | Status: DC
  Administered 2012-01-16 – 2012-01-17 (×4): 4 [IU] via SUBCUTANEOUS

## 2012-01-15 MED ORDER — NITROGLYCERIN 0.4 MG SL SUBL: 0.4000 mg | SUBLINGUAL_TABLET | SUBLINGUAL | Status: DC | PRN

## 2012-01-15 MED ORDER — CEFAZOLIN SODIUM 1-5 GM-% IV SOLN
INTRAVENOUS | Status: AC
  Filled 2012-01-15: qty 50

## 2012-01-15 MED ORDER — CITALOPRAM HYDROBROMIDE 40 MG PO TABS
40.0000 mg | ORAL_TABLET | Freq: Every day | ORAL | Status: DC
  Administered 2012-01-15 – 2012-01-17 (×3): 40 mg via ORAL
  Filled 2012-01-15 (×3): qty 1

## 2012-01-15 MED ORDER — AMLODIPINE BESYLATE 10 MG PO TABS
10.0000 mg | ORAL_TABLET | Freq: Every day | ORAL | Status: DC
  Administered 2012-01-15 – 2012-01-17 (×3): 10 mg via ORAL
  Filled 2012-01-15 (×3): qty 1

## 2012-01-15 MED ORDER — LISINOPRIL 5 MG PO TABS
5.0000 mg | ORAL_TABLET | Freq: Every day | ORAL | Status: DC
  Administered 2012-01-15 – 2012-01-17 (×3): 5 mg via ORAL
  Filled 2012-01-15 (×3): qty 1

## 2012-01-15 MED ORDER — ONDANSETRON HCL 4 MG/2ML IJ SOLN
4.0000 mg | Freq: Four times a day (QID) | INTRAMUSCULAR | Status: DC | PRN
  Administered 2012-01-15: 4 mg via INTRAVENOUS
  Filled 2012-01-15: qty 2

## 2012-01-15 MED ORDER — INSULIN ASPART 100 UNIT/ML ~~LOC~~ SOLN
15.0000 [IU] | Freq: Once | SUBCUTANEOUS | Status: AC
  Administered 2012-01-15: 15 [IU] via SUBCUTANEOUS

## 2012-01-15 MED ORDER — ASPIRIN 81 MG PO CHEW
CHEWABLE_TABLET | ORAL | Status: AC
  Filled 2012-01-15: qty 4

## 2012-01-15 MED ORDER — SODIUM CHLORIDE 0.9 % IV SOLN
Freq: Once | INTRAVENOUS | Status: AC
  Administered 2012-01-15: via INTRAVENOUS

## 2012-01-15 MED ORDER — LIVING WELL WITH DIABETES BOOK
Freq: Once | Status: AC
  Administered 2012-01-16: 06:00:00
  Filled 2012-01-15: qty 1

## 2012-01-15 MED ORDER — INSULIN ASPART 100 UNIT/ML ~~LOC~~ SOLN
0.0000 [IU] | Freq: Three times a day (TID) | SUBCUTANEOUS | Status: DC
  Administered 2012-01-15: 5 [IU] via SUBCUTANEOUS
  Administered 2012-01-15: 8 [IU] via SUBCUTANEOUS
  Administered 2012-01-15: 11 [IU] via SUBCUTANEOUS
  Filled 2012-01-15: qty 3

## 2012-01-15 MED ORDER — ASPIRIN 81 MG PO CHEW
81.0000 mg | CHEWABLE_TABLET | Freq: Every day | ORAL | Status: DC
  Administered 2012-01-16 – 2012-01-17 (×2): 81 mg via ORAL
  Filled 2012-01-15 (×2): qty 1

## 2012-01-15 MED ORDER — NITROGLYCERIN 0.2 MG/ML ON CALL CATH LAB
INTRAVENOUS | Status: AC
  Filled 2012-01-15: qty 1

## 2012-01-15 NOTE — ED Notes (Signed)
CODE STEMI ACTIVATED PER DR. Arlys John MILLER. CONTACTED EMS FOR PICK UP, [facility] HAD NO TRUCKS.

## 2012-01-15 NOTE — H&P (Signed)
NAMELESLE, Debbie              ACCOUNT NO.:  [id number]  MEDICAL RECORD NO.:  [id number]  LOCATION:  [facility]                         FACILITY:  [facility]  PHYSICIAN:  Natasha Bence, MD       DATE OF BIRTH:  1961-11-11  DATE OF ADMISSION:  01/15/2012 DATE OF DISCHARGE:                             HISTORY & PHYSICAL   CHIEF COMPLAINT:  Chest pain.  HISTORY OF PRESENT ILLNESS:  [name] is a 51 year old black female with hypertension, diabetes who presents with chest discomfort that started earlier today.  She says the pain has been waxing and waning for the past 12 hours.  She finally decided to seek medical care as the pain worsened.  It is also accompanied by shortness of breath and diaphoresis.  She reports she has had intermittent chest pains over the last several months not necessarily with exertion.  They come and go but not in series not for her to seek medical care.  Otherwise, she lives a sedentary lifestyle, has mild and stable dyspnea on exertion.  She denies any PND or orthopnea.  She has not had any problems with palpitations or syncope.  Otherwise, she denies any active melena or hematochezia.  She has no upcoming surgeries planned.  She denies any fevers, chills, or sweats.  REVIEW OF SYSTEMS:  GENERAL:  No weight loss, weight gain, fever, chills or sweats.  SKIN:  No rash or ulcers.  RESPIRATORY:  No PND or orthopnea.  CARDIOVASCULAR:  See HPI.  GI:  No melena or hematochezia. GU:  No dysuria or hematuria.  SKIN:  No rash or ulcers. MUSCULOSKELETAL:  No complaints.  Otherwise, completed review of systems was performed was negative except for what is stated above.  PAST MEDICAL HISTORY: 1. Hypertension. 2. Diabetes.  PAST SURGICAL HISTORY:  She has had a joint replacement and an abdominal hysterectomy.  FAMILY HISTORY:  She has got a very strong history of coronary artery disease.  Her father had an MI.  Her brother and sister both recently had MIs.  SOCIAL  HISTORY:  Smokes 1 pack per day.  Denies any illicit drug use.  ALLERGIES:  She has no known drug allergies.  MEDICATIONS:  She is supposed to be on metformin, glipizide, however, she only takes these intermittently, and she says she only takes her antihypertensive medications when her blood pressure is up.  She does not know what those are right now.  PHYSICAL EXAMINATION:  VITAL SIGNS:  Afebrile with temperature 98, pulse 86 and regular, respiratory rate of 16, blood pressure 111/67, O2 sats 100% on room air. GENERAL:  She is an obese black female in no apparent distress. EYES:  She has anicteric sclerae. NECK:  She has normal jugular venous pressure.  No carotid bruits. HEENT:  Mucous membranes are moist. LUNGS:  Clear to auscultation bilaterally. CARDIOVASCULAR:  Regular rate and rhythm.  No murmurs, rubs, or gallops. ABDOMEN:  Obese, soft, nontender, nondistended. EXTREMITIES:  Warm with no edema, symmetrical pulses throughout. NEURO:  Grossly afocal. SKIN:  No rash or ulcers.  LABORATORY DATA:  Sodium 130, potassium 3.7, chloride 94, bicarb 22, BUN 11, creatinine 0.9, calcium 9.8, glucose 414, CK was 152,  CK-MB was 2.8. Troponin was less than 0.3.  White blood cell count was 9.0, hematocrit was 40.7, platelet count was 228.  EKG showed sinus mechanism, she had lateral ST elevation with reciprocal inferior ST depression.  IMPRESSION AND PLAN:  This is a 51 year old hypertensive diabetic smoker with a strong family history of coronary artery disease who presented with chest discomfort and lateral ST elevation myocardial infarction. She was promptly taken to the catheterization lab, where she was found to have an occluded proximal left anterior descending.  She received a drug-eluting stent to this and was placed on dual anti-platelet therapy. She will be admitted to CCU.  We will obtain an echocardiogram to evaluate her left ventricular function.  Otherwise, we will check an  A1c and a lipid panel.  She is a poorly controlled diabetic, also hypertensive.  She has only been partially medically compliant.  Would stress the importance of compliance of these medications.  Also discussed smoking cessation.  We will place her on statin, aspirin, and beta-blocker as well.          ______________________________ Natasha Bence, MD     MH/MEDQ  D:  01/15/2012  T:  01/15/2012  Job:  [id number]

## 2012-01-15 NOTE — ED Provider Notes (Signed)
History     CSN: [id number]  Arrival date & time 01/15/12  0009   First MD Initiated Contact with Patient 01/15/12 0021      Chief Complaint  Patient presents with  . Chest Pain  . Shortness of Breath    (Consider location/radiation/quality/duration/timing/severity/associated sxs/prior treatment) HPI Comments: 51 year old female with a history of hypertension and diabetes who states that she does not take her medications like she should. She presents with substernal chest pain which is severe, has been fluctuating throughout the day but came on in the last hour that has been persistent. It is associated with diaphoresis and some shortness of breath. She did take her into pretense of medication prior to arrival. She's never had any symptoms like this in the past. She denies taking her diabetes medication recently and believes that she does have some hyper glycemia today. She denies swelling in the legs, back pain, abdominal pain, fevers, coughing  Patient is a 52 y.o. female presenting with chest pain and shortness of breath. The history is provided by the patient.  Chest Pain Primary symptoms include shortness of breath.    Shortness of Breath  Associated symptoms include chest pain and shortness of breath.    Past Medical History  Diagnosis Date  . Diabetes mellitus   . Hypertension     Past Surgical History  Procedure Date  . Joint replacement   . Abdominal hysterectomy     No family history on file.  History  Substance Use Topics  . Smoking status: Current Everyday Smoker -- 1.0 packs/day    Types: Cigarettes  . Smokeless tobacco: Not on file  . Alcohol Use:     OB History    Grav Para Term Preterm Abortions TAB SAB Ect Mult Living                  Review of Systems  Respiratory: Positive for shortness of breath.   Cardiovascular: Positive for chest pain.  All other systems reviewed and are negative.    Allergies  Review of patient's allergies  indicates no known allergies.  Home Medications  No current outpatient prescriptions on file.  BP 111/67  Pulse 86  Temp 98 F (36.7 C)  Resp 16  Wt 200 lb (90.719 kg)  SpO2 100%  Physical Exam  Nursing note and vitals reviewed. Constitutional: She appears well-developed and well-nourished. She appears distressed.  HENT:  Head: Normocephalic and atraumatic.  Mouth/Throat: Oropharynx is clear and moist. No oropharyngeal exudate.  Eyes: Conjunctivae and EOM are normal. Pupils are equal, round, and reactive to light. Right eye exhibits no discharge. Left eye exhibits no discharge. No scleral icterus.  Neck: Normal range of motion. Neck supple. No JVD present. No thyromegaly present.  Cardiovascular: Normal rate, regular rhythm, normal heart sounds and intact distal pulses.  Exam reveals no gallop and no friction rub.   No murmur heard. Pulmonary/Chest: Effort normal and breath sounds normal. No respiratory distress. She has no wheezes. She has no rales.  Abdominal: Soft. Bowel sounds are normal. She exhibits no distension and no mass. There is no tenderness.  Musculoskeletal: Normal range of motion. She exhibits no edema and no tenderness.  Lymphadenopathy:    She has no cervical adenopathy.  Neurological: She is alert. Coordination normal.  Skin: Skin is warm and dry. No rash noted. No erythema.  Psychiatric:       Anxious appearing    ED Course  Procedures (including critical care time)  Labs Reviewed  DIFFERENTIAL - Abnormal; Notable for the following:    Neutrophils Relative 37 (*)    Lymphocytes Relative 54 (*)    Lymphs Abs 4.8 (*)    All other components within normal limits  POCT I-STAT, CHEM 8 - Abnormal; Notable for the following:    Sodium 133 (*)    Glucose, Bld 405 (*)    All other components within normal limits  CBC  POCT I-STAT TROPONIN I  APTT  PROTIME-INR  CARDIAC PANEL(CRET KIN+CKTOT+MB+TROPI)  BASIC METABOLIC PANEL   No results found.   1.  STEMI (ST elevation myocardial infarction)       MDM  Vital signs appear normal at this time however the EKG for the patient shows an ST elevation MI in the lateral leads with inferior reciprocal changes. I have discussed the care with the cardiologist on call Dr. Eldridge Dace who has accepted the patient in transfer to the cardiac catheterization lab. Prior to transfer the patient was given aspirin 325 mg chewable as well as heparin bolus.  Procedure Note :  Peripheral IV placement  I have personally placed an 18  gauge IV angiocath in the R EJV  blood draw and saline flush successful and sterile dressing applied.  Pt tolerated without complaint or complication  ED ECG REPORT   Date: 01/15/2012   Rate: 89  Rhythm: normal sinus rhythm  QRS Axis: normal  Intervals: normal  ST/T Wave abnormalities: ST elevations laterally  Conduction Disutrbances:none  Narrative Interpretation:   Old EKG Reviewed: changes noted and Since 03/10/1999, now has ST elevation in lateral leads, Q wave in aVL, Q waves in anterior leads, significant ST depression in inferior leads and lateral precordial leads  CRITICAL CARE Performed by: Vida Roller   Total critical care time: 30  Critical care time was exclusive of separately billable procedures and treating other patients.  Critical care was necessary to treat or prevent imminent or life-threatening deterioration.  Critical care was time spent personally by me on the following activities: development of treatment plan with patient and/or surrogate as well as nursing, discussions with consultants, evaluation of patient's response to treatment, examination of patient, obtaining history from patient or surrogate, ordering and performing treatments and interventions, ordering and review of laboratory studies, ordering and review of radiographic studies, pulse oximetry and re-evaluation of patient's condition.          Vida Roller, MD 01/15/12  [phone]

## 2012-01-15 NOTE — ED Notes (Signed)
At triage window pt noted to be pale and diaphoretic, shortness of breath noted, pt c/o intermittent chest pain all day, increased tonight

## 2012-01-15 NOTE — Progress Notes (Signed)
Chaplain Note:  Chaplain responded to Code STEMI page received at 23:19, 01/14/12.  Pt was undergoing a cath procedure.  Chaplain spoke with pt following the conclusion of the procedure.  Pt was talkative and in good spirits.  Chaplain provided spiritual comfort and support for pt, pt's family, and staff.  Pt and family expressed appreciation for chaplain support.   01/15/12 0200  Clinical Encounter Type  Visited With Patient;Family;Health care provider  Visit Type Spiritual support;Critical Care  Referral From Other (Comment) (Code STEMI page)  Spiritual Encounters  Spiritual Needs Emotional  Stress Factors  Patient Stress Factors Major life changes;Health changes  Family Stress Factors None identified     Verdie Shire, chaplain resident [phone]

## 2012-01-15 NOTE — ED Notes (Signed)
Pt alert, c/o chest pain, sob, onset today, resp even, mild labored, skin diaphoretic, states pain started this am, left chest, radiates to shoulders, denies n/v

## 2012-01-15 NOTE — Progress Notes (Signed)
Cardiac Rehab [phone] Began MI ed. Pt sleepy. Will followup for ambulation and to complete ed. Travanti Mcmanus DunlapRn

## 2012-01-15 NOTE — Progress Notes (Signed)
Upon arrival to 2905 from cath lab, pts CBG was 427. Lab @ bedside to draw morning labs and obtain stat verification of CBG. Dr. Eldridge Dace paged and order received for 15 units SQ x 1. Will cont. To monitor.

## 2012-01-15 NOTE — H&P (Signed)
See dictated note.  Admitted with anterolateral STEMI and had DES to prox LAD.

## 2012-01-15 NOTE — Consult Note (Signed)
Pt smokes 1/2 ppd and is in action stage very eager to quit. She wants to quit cold [place]. Advised and encouraged the pt to quit. Referred to 1-800 quit now for f/u and support. Discussed oral fixation substitutes, second hand smoke and in home smoking policy. Reviewed and gave pt Written education/contact information.

## 2012-01-15 NOTE — Progress Notes (Signed)
Inpatient Diabetes Program Recommendations  AACE/ADA: New Consensus Statement on Inpatient Glycemic Control (2009)  Target Ranges:  Prepandial:   less than 140 mg/dL      Peak postprandial:   less than 180 mg/dL (1-2 hours)      Critically ill patients:  140 - 180 mg/dL   G4W=10.2 7/25  Inpatient Diabetes Program Recommendations Insulin - Basal: Lantus or Levemir 10-15 units   Will continue to follow during this admission.  Piedad Climes RN,BSN,CDE Inpatient Diabetes Coordinator

## 2012-01-15 NOTE — CV Procedure (Signed)
PROCEDURE:  Left heart catheterization with selective coronary angiography, left ventriculogram.  PCI LAD with drug eluting stent to the ostial LAD and PTCA to the distal LAD.  INDICATIONS:  Anterolateral STEMI  The risks, benefits, and details of the procedure were explained to the patient.  The patient verbalized understanding and wanted to proceed.  Informed written consent was obtained.  PROCEDURE TECHNIQUE:  After Xylocaine anesthesia a 59F sheath was placed in the right femoral artery with a single anterior needle wall stick.   Left coronary angiography was done using a CLS 3.0 guide catheter.  Right coronary angiography was done using a Judkins R4 guide catheter.  Left ventriculography was done using a pigtail catheter. Angioseal to right groin for hemostasis.  IC NTG given in the LAD to help relieve vasospasm.   CONTRAST:  Total of 215 cc.  COMPLICATIONS:  None.    HEMODYNAMICS:  Aortic pressure was 123/74; LV pressure was 123/6; LVEDP 14.  There was no gradient between the left ventricle and aorta.    ANGIOGRAPHIC DATA:   The left main coronary artery is widely patent.  The left anterior descending artery has an ostial 95% stenosis with TIMI 1 flow.  There is a medium sized diagonal which is patent.  The distal vessel is occluded.  The left circumflex artery is a large vessel.  The OM1 is small.  The OM2 is a large vessel and appears widely patent.  The right coronary artery is a large dominant vessel.  The PDA is medium sized and the PLA is small.  There are only minimal irregularities in the right system.  PCI Narrative:  CLS 3.0 Guide was used with angiomax for anticoagulation.  Prowater wire to ramus and BMX to the LAD.  2.0 x 12 Emerge balloon to LAD.  PTCA to the distal LAD due to apparent distal embolization.  2.5 x 16 Promus to the ostial LAD, postdilated with a 3.0 x 12 [place] Quantum apex.  No residual stenosis.  Lesion length 12 mm.  Flow improved in the distal LAD with IC NTG  and PTCA.  There was TIMI 3 flow at the end of teh procedure.  The apical LAD was quite small.  LEFT VENTRICULOGRAM:  Left ventricular angiogram was done in the 30 RAO projection and revealed apical hypokinesis and overall normal systolic function with an estimated ejection fraction of 55%.  LVEDP was 14 mmHg.  IMPRESSIONS:  1. 95% ostial LAD lesion with TIMI 1 flow causing acute anterior STEMI.  This was successfully treated with a 2.5 x 16 Promus stent postdilated to > 3.0 mm in diameter.  Minimal disease in the remaining vessels.  2. Apical hypokinesis.  LVEDP 14 mmHg.  Ejection fraction 55%.  RECOMMENDATION:  Continue dual antiplatelet therapy.  This was discussed with the patient before her stent was placed.  She was agreeable to taking her meds now because she was scared and knows she needs to take better care of herself.  Smoking cessation and secondary prevention.  Diabetes control.  Start statin and beta blocker.

## 2012-01-15 NOTE — Progress Notes (Signed)
Pt. States the weight of current situation has just begun to sink in. Comforted pt. And assured her that her feelings are normal for her situation. Offered opportunity to vent feelings, but she declined.

## 2012-01-15 NOTE — Progress Notes (Signed)
Dr. Eldridge Dace notified of con't. Escalating CBGs. Order recv'd. To increase to resistant scale Novolog.

## 2012-01-15 NOTE — Plan of Care (Signed)
Problem: Consults Goal: Tobacco Cessation referral if indicated Outcome: Progressing Ordered, to see today 2/26 Goal: Diabetes Guidelines if Diabetic/Glucose > 140 If diabetic or lab glucose is > 140 mg/dl - Initiate Diabetes/Hyperglycemia Guidelines & Document Interventions  Outcome: Progressing Pt. On CBGs and insulin coverage. Will likely need coaching as pt. States she is not taking meds as prescribed at home.  Problem: Phase I Progression Outcomes Goal: Initial discharge plan identified Outcome: Completed/Met Date Met:  01/15/12 Plan to return home

## 2012-01-16 LAB — BASIC METABOLIC PANEL
BUN: 12 mg/dL (ref 6–23)
Calcium: 10.3 mg/dL (ref 8.4–10.5)
Chloride: 93 mEq/L — ABNORMAL LOW (ref 96–112)
Creatinine, Ser: 0.93 mg/dL (ref 0.50–1.10)
GFR calc Af Amer: 81 mL/min — ABNORMAL LOW (ref >90)

## 2012-01-16 LAB — LIPID PANEL
HDL: 43 mg/dL (ref >39)
LDL Cholesterol: 115 mg/dL — ABNORMAL HIGH (ref 0–99)
Triglycerides: 373 mg/dL — ABNORMAL HIGH (ref <150)
VLDL: 75 mg/dL — ABNORMAL HIGH (ref 0–40)

## 2012-01-16 LAB — CBC
HCT: 45.6 % (ref 36.0–46.0)
MCH: 32.9 pg (ref 26.0–34.0)
MCHC: 34.9 g/dL (ref 30.0–36.0)
MCV: 94.2 fL (ref 78.0–100.0)
Platelets: 252 10*3/uL (ref 150–400)
RDW: 13 % (ref 11.5–15.5)

## 2012-01-16 LAB — GLUCOSE, CAPILLARY: Glucose-Capillary: 185 mg/dL — ABNORMAL HIGH (ref 70–99)

## 2012-01-16 MED ORDER — GLIPIZIDE 5 MG PO TABS
5.0000 mg | ORAL_TABLET | Freq: Two times a day (BID) | ORAL | Status: DC
  Administered 2012-01-17: 5 mg via ORAL
  Filled 2012-01-16 (×3): qty 1

## 2012-01-16 MED ORDER — METFORMIN HCL 500 MG PO TABS
500.0000 mg | ORAL_TABLET | Freq: Two times a day (BID) | ORAL | Status: DC
  Administered 2012-01-17: 500 mg via ORAL
  Filled 2012-01-16 (×3): qty 1

## 2012-01-16 MED FILL — Dextrose Inj 5%: INTRAVENOUS | Qty: 50 | Status: AC

## 2012-01-16 NOTE — Progress Notes (Signed)
UR Completed. Simmons, Breckon Reeves F [phone]  

## 2012-01-16 NOTE — Progress Notes (Signed)
CARDIAC REHAB PHASE I   PRE:  Rate/Rhythm: 65 SR  BP:  Supine:   Sitting: 114/67  Standing:    SaO2:   MODE:  Ambulation: 700 ft   POST:  Rate/Rhythem: 76 SR  BP:  Supine:   Sitting: 126/58  Standing:    SaO2:  1300-1335 Tolerated ambulation well without c/o of cp or SOB. VS stable, gait steady. Completed discharge education with pt. She agrees to [employer]. CRP in [place], will send referral.  Beatrix Fetters

## 2012-01-16 NOTE — Progress Notes (Signed)
SUBJECTIVE:  No chest pain.  No problems walking.  No bleeding problems in the right groin.  She is quite scared by this event.  She knows she has been noncompliant in the past with her diabetes.  She says she will now be compliant with all of her medications.  She has spoken to cardiac rehabilitation.  She wants to make changes to her diet and lifestyle that will keep her healthier.  OBJECTIVE:   Vitals:   Filed Vitals:   01/16/12 1000 01/16/12 1100 01/16/12 1200 01/16/12 1245  BP: 104/67 104/67 124/67   Pulse:  78    Temp:    98.5 F (36.9 C)  TempSrc:    Oral  Resp:   16   Height:      Weight:      SpO2:   98%    I&O's:   Intake/Output Summary (Last 24 hours) at 01/16/12 1421 Last data filed at 01/16/12 1200  Gross per 24 hour  Intake    490 ml  Output   2100 ml  Net  -1610 ml   TELEMETRY: Reviewed telemetry pt in normal sinus rhythm.     PHYSICAL EXAM General: Well developed, well nourished, in no acute distress Head: Marland Kitchen   Normal cephalic and atramatic  Lungs:  Clear bilaterally to auscultation and percussion. Heart:   HRRR S1 S2  Abdomen:  abdomen soft and non-tender Msk:  Back normal, . Normal strength and tone for age. Extremities:   No clubbing, cyanosis or edema.   No hematoma in the right groin. Neuro: Alert and oriented X 3. Psych:  Good affect, responds appropriately   LABS: Basic Metabolic Panel:  Basename 01/16/12 0630 01/15/12 0827  NA 131* 132*  K 4.2 4.2  CL 93* 94*  CO2 25 25  GLUCOSE 383* 228*  BUN 12 9  CREATININE 0.93 0.63  CALCIUM 10.3 10.9*  MG -- --  PHOS -- --   Liver Function Tests: No results found for this basename: AST:2,ALT:2,ALKPHOS:2,BILITOT:2,PROT:2,ALBUMIN:2 in the last 72 hours No results found for this basename: LIPASE:2,AMYLASE:2 in the last 72 hours CBC:  Basename 01/16/12 0630 01/15/12 0300 01/15/12 0026  WBC 6.5 10.0 --  NEUTROABS -- -- 3.3  HGB 15.9* 14.4 --  HCT 45.6 41.3 --  MCV 94.2 95.6 --  PLT 252 234 --     Cardiac Enzymes:  Basename 01/15/12 1511 01/15/12 0827 01/15/12 0258  CKTOTAL 1266* 1970* 1110*  CKMB 63.4* 119.8* 62.6*  CKMBINDEX -- -- --  TROPONINI >25.00* >25.00* 23.31*   BNP: No components found with this basename: POCBNP:3 D-Dimer: No results found for this basename: DDIMER:2 in the last 72 hours Hemoglobin A1C:  Basename 01/15/12 0300  HGBA1C 11.5*   Fasting Lipid Panel:  Basename 01/16/12 0630  CHOL 233*  HDL 43  LDLCALC 115*  TRIG 373*  CHOLHDL 5.4  LDLDIRECT --   Thyroid Function Tests: No results found for this basename: TSH,T4TOTAL,FREET3,T3FREE,THYROIDAB in the last 72 hours Anemia Panel: No results found for this basename: VITAMINB12,FOLATE,FERRITIN,TIBC,IRON,RETICCTPCT in the last 72 hours Coag Panel:   Lab Results  Component Value Date   INR 0.85 01/15/2012   INR 0.9 10/11/2008    RADIOLOGY: No results found.    ASSESSMENT: Anterior MI.  Uncontrolled diabetes.  Hyperlipidemia.  Noncompliance  PLAN:  No evidence of heart failure.  Continue dual antiplatelet therapy with aspirin and caffeine.  Continue low-dose beta blocker.  She is tolerating ACE inhibitor at this time as well.  Blood pressure  is much better controlled now.  She has not been taking her diabetes medicines which is reflected by her hemoglobin A1c of 11.5.  She does not want to have to take insulin.  We'll restart glipizide today.  We'll be able to restart metformin tomorrow.  She needs to find a new primary care doctor.  We will try to help her with that before she leaves the hospital.  Hopefully with dietary changes and oral medications, she will be able to get her glucoses in a more reasonable range.  Lipitor is been started.  LDL is 115.  Triglycerides are elevated.  The triglyceride should come down with better glucose control.  She was supposed to be taking Pravachol as an outpatient but she was noncompliant.  I spoke with the patient at length and with her daughter as well.   They both understand the need for compliance with taking her medicines regularly.  I stressed the importance of dual antiplatelet therapy.  If she misses these medicines, she could thrombose her stent and have another heart attack.   Corky Crafts., MD  01/16/2012  2:21 PM

## 2012-01-17 LAB — BASIC METABOLIC PANEL
CO2: 26 mEq/L (ref 19–32)
Chloride: 96 mEq/L (ref 96–112)
GFR calc non Af Amer: 67 mL/min — ABNORMAL LOW (ref >90)
Glucose, Bld: 312 mg/dL — ABNORMAL HIGH (ref 70–99)
Potassium: 4.2 mEq/L (ref 3.5–5.1)
Sodium: 132 mEq/L — ABNORMAL LOW (ref 135–145)

## 2012-01-17 LAB — GLUCOSE, CAPILLARY: Glucose-Capillary: 316 mg/dL — ABNORMAL HIGH (ref 70–99)

## 2012-01-17 MED ORDER — NITROGLYCERIN 0.4 MG SL SUBL: 0.4000 mg | SUBLINGUAL_TABLET | SUBLINGUAL | Status: DC | PRN

## 2012-01-17 MED ORDER — GLIPIZIDE-METFORMIN HCL 5-500 MG PO TABS: 1.0000 | ORAL_TABLET | Freq: Two times a day (BID) | ORAL | Status: DC

## 2012-01-17 MED ORDER — GLIPIZIDE ER 5 MG PO TB24
5.0000 mg | ORAL_TABLET | Freq: Every day | ORAL | Status: DC
  Filled 2012-01-17 (×2): qty 1

## 2012-01-17 MED ORDER — ATORVASTATIN CALCIUM 20 MG PO TABS: 20.0000 mg | ORAL_TABLET | Freq: Every day | ORAL | Status: DC

## 2012-01-17 MED ORDER — LISINOPRIL 5 MG PO TABS: 5.0000 mg | ORAL_TABLET | Freq: Every day | ORAL | Status: DC

## 2012-01-17 MED ORDER — ASPIRIN 81 MG PO CHEW: 81.0000 mg | CHEWABLE_TABLET | Freq: Every day | ORAL | Status: DC

## 2012-01-17 MED ORDER — METOPROLOL TARTRATE 12.5 MG HALF TABLET: 12.5000 mg | ORAL_TABLET | Freq: Two times a day (BID) | ORAL | Status: DC

## 2012-01-17 MED ORDER — PRASUGREL HCL 10 MG PO TABS: 10.0000 mg | ORAL_TABLET | Freq: Every day | ORAL | Status: DC

## 2012-01-17 NOTE — Discharge Instructions (Signed)
No lifting more than 10 lbs for one week. Avoid concentrated sweets. No smoking. Make sure to take Effient and aspirin.  If there is a problem getting the Effient, please call our office [phone].

## 2012-01-17 NOTE — Discharge Summary (Signed)
Patient ID: Debbie [name] MRN: [id number] DOB/AGE: Jan 28, 1961 51 y.o.  Admit date: 01/15/2012 Discharge date: 01/17/2012  Primary Discharge Diagnosis acute antero lateral ST elevation MI Secondary Discharge Diagnosis poorly controlled diabetes, hypertension, tobacco abuse, noncompliance  Significant Diagnostic Studies: angiography: 95% ostial LAD lesion with TIMI 1 flow.  Successful drug-eluting stent placement to the ostial LAD.  Balloon angioplasty to the distal LAD.  Restoration of TIMI 3 flow.  Left ventricular ejection fraction 55%.  Small area of apical akinesis and aneurysmal segment which is reflected by persistent ST elevation on her ECG.  Consults: None  Hospital Course: The patient was brought emergently to the cath lab.  She underwent angioplasty to the ostial LAD.  When we spoke afterwards, she admitted to a long history of noncompliance.  She was not following any specific diet.  She was really not taking any of her prescribed home medicines.  She states she had been discharged from her primary care doctor's practice.  This event scared her quite a bit.  She was now ready to start taking care of herself.  I spoke at length with her daughter about the importance of lifestyle modification.  During the catheterization, we had to make a decision about a drug-eluting stent placement.  Given the location of the lesion and the fact that she was diabetic, a drug-eluting stent was certainly preferable.  Prior to placing the stent, I asked the patient if she would be willing to take dual antiplatelet therapy for a year without interruption.  She said she was agreeable and she would do whatever it took to maintain her health.  I spoke with her daughter about this afterwards as well.  She assured me that her family with would make sure that the patient took her medications as prescribed.  Her blood sugars while in the hospital were quite elevated.  Her metformin was held for 2 days after the  procedure.  Sliding scale insulins use.  Hemoglobin A1c was over 11.  2 days after the procedure, we restart her glipizide and Glucophage.  In reality, she had not been taking this at all.  She did not want to go on insulins.  We decided to restart oral medications.  She will have close followup with a new primary care Dr. and we will also refer her to an endocrinologist.  We stressed the importance of no concentrated sweets.  She is to avoid white bread, potatoes, white rice.  Eventually, she'll need to start exercising.  Her blood pressure was quite high initially.  This came down quite easily with low-dose ACE inhibitor and amlodipine.  She was also tolerating low-dose beta blocker.  She spoke with smoking cessation consult and.  She spoke with cardiac rehabilitation.  She is planning on doing cardiac rehabilitation.  She had no further chest pain.  No significant  arrhythmias were noted on telemetry.  We switched lipid-lowering therapy from pravastatin to Lipitor due to the fact that her LDL needs to be reduced quite a bit.   Discharge Exam: Blood pressure 126/72, pulse 74, temperature 98.8 F (37.1 C), temperature source Oral, resp. rate 20, height 5\' 8"  (1.727 m), weight 86.5 kg (190 lb 11.2 oz), SpO2 95.00%.   [place]/AT Regular rate and rhythm, S1-S2 Clear to auscultation bilaterally Soft, nontender No edema No focal neurologic deficits Normal affect  Labs:   Lab Results  Component Value Date   WBC 6.5 01/16/2012   HGB 15.9* 01/16/2012   HCT 45.6 01/16/2012   MCV  94.2 01/16/2012   PLT 252 01/16/2012    Lab 01/17/12 0520  NA 132*  K 4.2  CL 96  CO2 26  BUN 21  CREATININE 0.96  CALCIUM 10.0  PROT --  BILITOT --  ALKPHOS --  ALT --  AST --  GLUCOSE 312*   Lab Results  Component Value Date   CKTOTAL 1266* 01/15/2012   CKMB 63.4* 01/15/2012   TROPONINI >25.00* 01/15/2012    Lab Results  Component Value Date   CHOL 233* 01/16/2012   CHOL 227* 01/15/2012   Lab Results    Component Value Date   HDL 43 01/16/2012   HDL 44 02/25/8118   Lab Results  Component Value Date   LDLCALC 115* 01/16/2012   LDLCALC UNABLE TO CALCULATE IF TRIGLYCERIDE OVER 400 mg/dL 1/47/8295   Lab Results  Component Value Date   TRIG 373* 01/16/2012   TRIG 521* 01/15/2012   Lab Results  Component Value Date   CHOLHDL 5.4 01/16/2012   CHOLHDL 5.2 01/15/2012   No results found for this basename: LDLDIRECT       AOZ:HYQMVH sinus rhythm with anterior Q waves and persistent ST elevation, 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  FOLLOW UP PLANS AND APPOINTMENTS  Medication List  As of 01/17/2012 10:52 AM   STOP taking these medications         etodolac 400 MG 24 hr tablet      lisinopril-hydrochlorothiazide 20-25 MG per tablet      pravastatin 20 MG tablet         TAKE these medications         amLODipine 10 MG tablet   Commonly known as: NORVASC   Take 10 mg by mouth daily.      aspirin 81 MG chewable tablet   Chew 1 tablet (81 mg total) by mouth daily.      atorvastatin 20 MG tablet   Commonly known as: LIPITOR   Take 1 tablet (20 mg total) by mouth daily at 6 PM.      citalopram 20 MG tablet   Commonly known as: CELEXA   Take 40 mg by mouth daily.      glipiZIDE-metformin 5-500 MG per tablet   Commonly known as: METAGLIP   Take 1 tablet by mouth 2 (two) times daily before a meal.      lisinopril 5 MG tablet   Commonly known as: PRINIVIL,ZESTRIL   Take 1 tablet (5 mg total) by mouth daily.      metoprolol tartrate 12.5 mg Tabs   Commonly known as: LOPRESSOR   Take 0.5 tablets (12.5 mg total) by mouth 2 (two) times daily.      nitroGLYCERIN 0.4 MG SL tablet   Commonly known as: NITROSTAT   Place 1 tablet (0.4 mg total) under the tongue every 5 (five) minutes x 3 doses as needed for chest pain.      prasugrel 10 MG Tabs   Commonly known as: EFFIENT   Take 1 tablet (10 mg total) by mouth daily.           Follow-up  Information    Follow up with Corky Crafts., MD on 02/04/2012. (3:30 PM)    Contact information:   [address] [place] [address] [phone]       Follow up with Berenda Morale, MD on 01/21/2012.   Contact information:   [address] [place] [address] [phone]  Follow up with KERR,JEFFREY, MD. (or one of his partners; office will call)    Contact information:   [address] [facility] [place] [address] [phone]          BRING ALL MEDICATIONS WITH YOU TO FOLLOW UP APPOINTMENTS  Time spent with patient to include physician time:40 minutes coordinating followup care and explaining new medications to the patient.  SignedCorky Crafts. 01/17/2012, 10:52 AM

## 2012-01-17 NOTE — Progress Notes (Signed)
   CARE MANAGEMENT NOTE 01/17/2012  Patient:  Debbie [name],Debbie [name]   Account Number:  [id number]  Date Initiated:  01/17/2012  Documentation initiated by:  GRAVES-BIGELOW,Teddy Rebstock  Subjective/Objective Assessment:   Pt in with cp. S/p stent placement. Plan for home on effient. CM did give pt an effient card and called [facility] pharmacy to make sure that was affordable and co pay cost will be 3.00.     Action/Plan:   Anticipated DC Date:  01/17/2012   Anticipated DC Plan:  HOME/SELF CARE      DC Planning Services  CM consult      Choice offered to / List presented to:             Status of service:  Completed, signed off Medicare Important Message given?   (If response is "NO", the following Medicare IM given date fields will be blank) Date Medicare IM given:   Date Additional Medicare IM given:    Discharge Disposition:  HOME/SELF CARE  Per UR Regulation:    Comments:

## 2012-01-17 NOTE — Progress Notes (Signed)
Pt was provided with discharge education. Pt is ready for discharge. Pt will follow up with her doctors as arranged. Pt stated that the most important things we covered were "stop smoking and take my pills so that i dont clot off." Pt was given written prescription and 30 day free card for effient. EJ was removed without difficulty. No s/s of distress and no questions at this time. Pt is waiting for ride home. Will continue to monitor until patient leaves floor.Ramond Craver, Rn

## 2012-04-12 ENCOUNTER — Emergency Department (HOSPITAL_COMMUNITY): Payer: Medicare Other

## 2012-04-12 ENCOUNTER — Encounter (HOSPITAL_COMMUNITY): Payer: Self-pay | Admitting: *Deleted

## 2012-04-12 ENCOUNTER — Emergency Department (HOSPITAL_COMMUNITY)
Admission: EM | Admit: 2012-04-12 | Discharge: 2012-04-12 | Disposition: A | Discharge status: Home / Self Care | Payer: Medicare Other | Attending: Emergency Medicine | Admitting: Emergency Medicine

## 2012-04-12 DIAGNOSIS — M129 Arthropathy, unspecified: Secondary | ICD-10-CM | POA: Insufficient documentation

## 2012-04-12 DIAGNOSIS — I10 Essential (primary) hypertension: Secondary | ICD-10-CM | POA: Insufficient documentation

## 2012-04-12 DIAGNOSIS — F341 Dysthymic disorder: Secondary | ICD-10-CM | POA: Insufficient documentation

## 2012-04-12 DIAGNOSIS — R05 Cough: Secondary | ICD-10-CM | POA: Insufficient documentation

## 2012-04-12 DIAGNOSIS — E119 Type 2 diabetes mellitus without complications: Secondary | ICD-10-CM | POA: Insufficient documentation

## 2012-04-12 DIAGNOSIS — J45901 Unspecified asthma with (acute) exacerbation: Secondary | ICD-10-CM

## 2012-04-12 DIAGNOSIS — Z79899 Other long term (current) drug therapy: Secondary | ICD-10-CM | POA: Insufficient documentation

## 2012-04-12 DIAGNOSIS — R059 Cough, unspecified: Secondary | ICD-10-CM | POA: Insufficient documentation

## 2012-04-12 DIAGNOSIS — R0602 Shortness of breath: Secondary | ICD-10-CM | POA: Insufficient documentation

## 2012-04-12 MED ORDER — ALBUTEROL SULFATE (5 MG/ML) 0.5% IN NEBU
5.0000 mg | INHALATION_SOLUTION | Freq: Once | RESPIRATORY_TRACT | Status: AC
  Administered 2012-04-12: 5 mg via RESPIRATORY_TRACT
  Filled 2012-04-12: qty 1

## 2012-04-12 MED ORDER — PREDNISONE 20 MG PO TABS
60.0000 mg | ORAL_TABLET | Freq: Once | ORAL | Status: AC
  Administered 2012-04-12: 60 mg via ORAL
  Filled 2012-04-12: qty 3

## 2012-04-12 MED ORDER — IPRATROPIUM BROMIDE 0.02 % IN SOLN
0.5000 mg | Freq: Once | RESPIRATORY_TRACT | Status: AC
  Administered 2012-04-12: 0.5 mg via RESPIRATORY_TRACT
  Filled 2012-04-12 (×2): qty 2.5

## 2012-04-12 MED ORDER — ALBUTEROL SULFATE HFA 108 (90 BASE) MCG/ACT IN AERS
1.0000 | INHALATION_SPRAY | RESPIRATORY_TRACT | Status: DC
  Administered 2012-04-12: 2 via RESPIRATORY_TRACT
  Filled 2012-04-12: qty 6.7

## 2012-04-12 MED ORDER — PREDNISONE 20 MG PO TABS: 60.0000 mg | ORAL_TABLET | Freq: Every day | ORAL | Status: AC

## 2012-04-12 NOTE — Discharge Instructions (Signed)

## 2012-04-12 NOTE — ED Provider Notes (Signed)
History     CSN: [id number]  Arrival date & time 04/12/12  1341   First MD Initiated Contact with Patient 04/12/12 1419      Chief Complaint  Patient presents with  . Asthma  . Shortness of Breath    HPI Patient p/w one week of cough / sob / lh / wheezing.  Sx began gradually, and since onset have been worsening in spite of home albuterol use.  No clear exacerbating factors.  No f/c, no cp, no ha, no syncope.   Past Medical History  Diagnosis Date  . Diabetes mellitus   . Hypertension   . Depression   . Anxiety   . Angina   . Asthma   . Arthritis     Past Surgical History  Procedure Date  . Joint replacement 2011    left knee replacement  . Abdominal hysterectomy 2002  . Plate in cervical spine from herniated disc 2007  . Breast surgery     breast reduction 2002  . Carotid stent   . Coronary stent placement     No family history on file.  History  Substance Use Topics  . Smoking status: Current Everyday Smoker -- 1.0 packs/day    Types: Cigarettes  . Smokeless tobacco: Not on file  . Alcohol Use: Yes     occ    OB History    Grav Para Term Preterm Abortions TAB SAB Ect Mult Living                  Review of Systems  Constitutional:       HPI  HENT:       HPI otherwise negative  Eyes: Negative.   Respiratory:       HPI, otherwise negative  Cardiovascular:       HPI, otherwise nmegative  Gastrointestinal: Negative for vomiting.  Genitourinary:       HPI, otherwise negative  Musculoskeletal:       HPI, otherwise negative  Skin: Negative.   Neurological: Negative for syncope.    Allergies  Review of patient's allergies indicates no known allergies.  Home Medications   Current Outpatient Rx  Name Route Sig Dispense Refill  . AMLODIPINE BESYLATE 10 MG PO TABS Oral Take 10 mg by mouth daily.    . ASPIRIN 81 MG PO CHEW Oral Chew 1 tablet (81 mg total) by mouth daily.    . ATORVASTATIN CALCIUM 20 MG PO TABS Oral Take 20 mg by mouth daily at  6 PM.    . GLIPIZIDE-METFORMIN HCL 5-500 MG PO TABS Oral Take 1 tablet by mouth 2 (two) times daily before a meal.    . LISINOPRIL 5 MG PO TABS Oral Take 5 mg by mouth daily.    Marland Kitchen METOPROLOL TARTRATE 12.5 MG HALF TABLET Oral Take 12.5 mg by mouth 2 (two) times daily.    Marland Kitchen NITROGLYCERIN 0.4 MG SL SUBL Sublingual Place 1 tablet (0.4 mg total) under the tongue every 5 (five) minutes x 3 doses as needed for chest pain. 25 tablet 6  . PRASUGREL HCL 10 MG PO TABS Oral Take 1 tablet (10 mg total) by mouth daily. 30 tablet 11    BP 135/60  Pulse 60  Temp(Src) 98.3 F (36.8 C) (Oral)  Resp 22  SpO2 94%  Physical Exam  Nursing note and vitals reviewed. Constitutional: She is oriented to person, place, and time. She appears well-developed and well-nourished. No distress.  HENT:  Head: Normocephalic and atraumatic.  Eyes: Conjunctivae and EOM are normal.  Cardiovascular: Normal rate and regular rhythm.   Pulmonary/Chest: No stridor. Tachypnea noted. No respiratory distress. She has wheezes. She exhibits no tenderness.       Patient received one treatment prior to my eval, but remains wheezy.  Abdominal: She exhibits no distension.  Musculoskeletal: She exhibits no edema.  Neurological: She is alert and oriented to person, place, and time. No cranial nerve deficit.  Skin: Skin is warm and dry.  Psychiatric: She has a normal mood and affect.    ED Course  Procedures (including critical care time)  Labs Reviewed - No data to display No results found.   No diagnosis found.  Pulse ox 100% ra, normal  cxr reviewed by me  5:54 PM The patient feels substantially better MDM   This female with history of asthma now presents with one week of dyspnea, cough, wheezing.  The patient had substantial improvement in her condition with multiple albuterol treatments, steroids.  Given the resolution of symptoms, her denial of other notable medical issues currently, she was discharged in stable  condition to follow up with her primary care physician to       Gerhard Munch, MD 04/12/12 1755

## 2012-04-12 NOTE — ED Notes (Signed)
Pt states she feels much better and is ready to go home, lung sounds slightly diminished but wheezing has improved.

## 2012-04-12 NOTE — ED Notes (Signed)
Respiratory called for neb treatment.

## 2012-04-12 NOTE — ED Notes (Signed)
Pt has history of asthma,. Symptoms for about a week, has been treating at home with inhaler, no relief

## 2012-10-23 ENCOUNTER — Emergency Department (HOSPITAL_COMMUNITY): Payer: Medicare Other

## 2012-10-23 ENCOUNTER — Encounter (HOSPITAL_COMMUNITY): Payer: Self-pay

## 2012-10-23 ENCOUNTER — Inpatient Hospital Stay (HOSPITAL_COMMUNITY)
Admission: EM | Admit: 2012-10-23 | Discharge: 2012-10-25 | DRG: 287 | Disposition: A | Discharge status: Home / Self Care | Payer: Medicare Other | Attending: Interventional Cardiology | Admitting: Interventional Cardiology

## 2012-10-23 DIAGNOSIS — J45909 Unspecified asthma, uncomplicated: Secondary | ICD-10-CM | POA: Diagnosis present

## 2012-10-23 DIAGNOSIS — R079 Chest pain, unspecified: Secondary | ICD-10-CM

## 2012-10-23 DIAGNOSIS — Z7902 Long term (current) use of antithrombotics/antiplatelets: Secondary | ICD-10-CM

## 2012-10-23 DIAGNOSIS — Z7982 Long term (current) use of aspirin: Secondary | ICD-10-CM

## 2012-10-23 DIAGNOSIS — T82897A Other specified complication of cardiac prosthetic devices, implants and grafts, initial encounter: Principal | ICD-10-CM | POA: Diagnosis present

## 2012-10-23 DIAGNOSIS — Z96659 Presence of unspecified artificial knee joint: Secondary | ICD-10-CM

## 2012-10-23 DIAGNOSIS — E119 Type 2 diabetes mellitus without complications: Secondary | ICD-10-CM | POA: Diagnosis present

## 2012-10-23 DIAGNOSIS — Y92009 Unspecified place in unspecified non-institutional (private) residence as the place of occurrence of the external cause: Secondary | ICD-10-CM

## 2012-10-23 DIAGNOSIS — F341 Dysthymic disorder: Secondary | ICD-10-CM | POA: Diagnosis present

## 2012-10-23 DIAGNOSIS — M129 Arthropathy, unspecified: Secondary | ICD-10-CM | POA: Diagnosis present

## 2012-10-23 DIAGNOSIS — Y840 Cardiac catheterization as the cause of abnormal reaction of the patient, or of later complication, without mention of misadventure at the time of the procedure: Secondary | ICD-10-CM | POA: Diagnosis present

## 2012-10-23 DIAGNOSIS — I214 Non-ST elevation (NSTEMI) myocardial infarction: Secondary | ICD-10-CM

## 2012-10-23 DIAGNOSIS — R739 Hyperglycemia, unspecified: Secondary | ICD-10-CM

## 2012-10-23 DIAGNOSIS — Z9861 Coronary angioplasty status: Secondary | ICD-10-CM

## 2012-10-23 DIAGNOSIS — I200 Unstable angina: Secondary | ICD-10-CM | POA: Diagnosis present

## 2012-10-23 DIAGNOSIS — Z79899 Other long term (current) drug therapy: Secondary | ICD-10-CM

## 2012-10-23 DIAGNOSIS — F17200 Nicotine dependence, unspecified, uncomplicated: Secondary | ICD-10-CM | POA: Diagnosis present

## 2012-10-23 DIAGNOSIS — I10 Essential (primary) hypertension: Secondary | ICD-10-CM

## 2012-10-23 DIAGNOSIS — I2510 Atherosclerotic heart disease of native coronary artery without angina pectoris: Secondary | ICD-10-CM

## 2012-10-23 HISTORY — DX: Atherosclerotic heart disease of native coronary artery without angina pectoris: I25.10

## 2012-10-23 LAB — BASIC METABOLIC PANEL
BUN: 15 mg/dL (ref 6–23)
CO2: 23 mEq/L (ref 19–32)
GFR calc non Af Amer: >90 mL/min (ref >90)
Glucose, Bld: 528 mg/dL — ABNORMAL HIGH (ref 70–99)
Potassium: 4 mEq/L (ref 3.5–5.1)
Sodium: 133 mEq/L — ABNORMAL LOW (ref 135–145)

## 2012-10-23 LAB — CBC WITH DIFFERENTIAL/PLATELET
Basophils Absolute: 0.1 10*3/uL (ref 0.0–0.1)
Eosinophils Absolute: 0.1 10*3/uL (ref 0.0–0.7)
Eosinophils Absolute: 0.2 10*3/uL (ref 0.0–0.7)
Eosinophils Relative: 3 % (ref 0–5)
Hemoglobin: 13.1 g/dL (ref 12.0–15.0)
Lymphocytes Relative: 33 % (ref 12–46)
Lymphocytes Relative: 55 % — ABNORMAL HIGH (ref 12–46)
Lymphs Abs: 1.9 10*3/uL (ref 0.7–4.0)
Lymphs Abs: 3.9 10*3/uL (ref 0.7–4.0)
MCH: 32 pg (ref 26.0–34.0)
MCH: 30.9 pg (ref 26.0–34.0)
MCV: 95.4 fL (ref 78.0–100.0)
MCV: 95 fL (ref 78.0–100.0)
Monocytes Relative: 7 % (ref 3–12)
Neutrophils Relative %: 58 % (ref 43–77)
Neutrophils Relative %: 35 % — ABNORMAL LOW (ref 43–77)
Platelets: 194 10*3/uL (ref 150–400)
Platelets: 211 10*3/uL (ref 150–400)
RBC: 4.1 MIL/uL (ref 3.87–5.11)
RBC: 4.01 MIL/uL (ref 3.87–5.11)
RDW: 12.9 % (ref 11.5–15.5)
WBC: 5.8 10*3/uL (ref 4.0–10.5)
WBC: 7.1 10*3/uL (ref 4.0–10.5)

## 2012-10-23 LAB — COMPREHENSIVE METABOLIC PANEL
ALT: 7 U/L (ref 0–35)
AST: 11 U/L (ref 0–37)
Albumin: 3.2 g/dL — ABNORMAL LOW (ref 3.5–5.2)
CO2: 25 mEq/L (ref 19–32)
Calcium: 8.5 mg/dL (ref 8.4–10.5)
Creatinine, Ser: 0.6 mg/dL (ref 0.50–1.10)
Sodium: 140 mEq/L (ref 135–145)
Total Protein: 6 g/dL (ref 6.0–8.3)

## 2012-10-23 LAB — TROPONIN I: Troponin I: <0.3 ng/mL (ref <0.30)

## 2012-10-23 LAB — MRSA PCR SCREENING: MRSA by PCR: NEGATIVE

## 2012-10-23 LAB — POCT I-STAT TROPONIN I

## 2012-10-23 LAB — GLUCOSE, CAPILLARY: Glucose-Capillary: 397 mg/dL — ABNORMAL HIGH (ref 70–99)

## 2012-10-23 MED ORDER — SODIUM CHLORIDE 0.9 % IV SOLN
INTRAVENOUS | Status: DC
  Administered 2012-10-23 – 2012-10-24 (×2): via INTRAVENOUS

## 2012-10-23 MED ORDER — ASPIRIN 300 MG RE SUPP: 300.0000 mg | RECTAL | Status: DC

## 2012-10-23 MED ORDER — NITROGLYCERIN 0.4 MG SL SUBL: 0.4000 mg | SUBLINGUAL_TABLET | SUBLINGUAL | Status: DC | PRN

## 2012-10-23 MED ORDER — ASPIRIN 81 MG PO CHEW: 324.0000 mg | CHEWABLE_TABLET | ORAL | Status: DC

## 2012-10-23 MED ORDER — ONDANSETRON HCL 4 MG/2ML IJ SOLN
4.0000 mg | Freq: Once | INTRAMUSCULAR | Status: AC
  Administered 2012-10-23: 4 mg via INTRAVENOUS
  Filled 2012-10-23: qty 2

## 2012-10-23 MED ORDER — METOPROLOL TARTRATE 12.5 MG HALF TABLET: 12.5000 mg | ORAL_TABLET | Freq: Two times a day (BID) | ORAL | Status: DC

## 2012-10-23 MED ORDER — SODIUM CHLORIDE 0.9 % IV SOLN
INTRAVENOUS | Status: DC
  Administered 2012-10-23: 3.4 [IU]/h via INTRAVENOUS
  Filled 2012-10-23: qty 1

## 2012-10-23 MED ORDER — ATORVASTATIN CALCIUM 20 MG PO TABS: 20.0000 mg | ORAL_TABLET | Freq: Every day | ORAL | Status: DC

## 2012-10-23 MED ORDER — NITROGLYCERIN IN D5W 200-5 MCG/ML-% IV SOLN
10.0000 ug/min | INTRAVENOUS | Status: DC
  Administered 2012-10-23: 20 ug/min via INTRAVENOUS
  Administered 2012-10-23: 10 ug/min via INTRAVENOUS
  Filled 2012-10-23: qty 250

## 2012-10-23 MED ORDER — HEPARIN BOLUS VIA INFUSION: 4000.0000 [IU] | Freq: Once | INTRAVENOUS | Status: DC

## 2012-10-23 MED ORDER — ACETAMINOPHEN 325 MG PO TABS: 650.0000 mg | ORAL_TABLET | ORAL | Status: DC | PRN

## 2012-10-23 MED ORDER — ONDANSETRON HCL 4 MG/2ML IJ SOLN
4.0000 mg | Freq: Four times a day (QID) | INTRAMUSCULAR | Status: DC | PRN
  Administered 2012-10-23: 4 mg via INTRAVENOUS
  Filled 2012-10-23: qty 2

## 2012-10-23 MED ORDER — SODIUM CHLORIDE 0.9 % IV SOLN: 250.0000 mL | INTRAVENOUS | Status: DC | PRN

## 2012-10-23 MED ORDER — EPTIFIBATIDE BOLUS VIA INFUSION
180.0000 ug/kg | Freq: Once | INTRAVENOUS | Status: DC
  Filled 2012-10-23: qty 23

## 2012-10-23 MED ORDER — HEPARIN (PORCINE) IN NACL 100-0.45 UNIT/ML-% IJ SOLN
1400.0000 [IU]/h | INTRAMUSCULAR | Status: DC
  Administered 2012-10-23: 1150 [IU]/h via INTRAVENOUS
  Administered 2012-10-24: 1400 [IU]/h via INTRAVENOUS
  Filled 2012-10-23 (×3): qty 250

## 2012-10-23 MED ORDER — NITROGLYCERIN 2 % TD OINT: 1.0000 [in_us] | TOPICAL_OINTMENT | Freq: Once | TRANSDERMAL | Status: DC

## 2012-10-23 MED ORDER — ATORVASTATIN CALCIUM 20 MG PO TABS
20.0000 mg | ORAL_TABLET | Freq: Every day | ORAL | Status: DC
  Administered 2012-10-24: 20 mg via ORAL
  Filled 2012-10-23 (×2): qty 1

## 2012-10-23 MED ORDER — LISINOPRIL 5 MG PO TABS
5.0000 mg | ORAL_TABLET | Freq: Every day | ORAL | Status: DC
  Administered 2012-10-23 – 2012-10-25 (×3): 5 mg via ORAL
  Filled 2012-10-23 (×3): qty 1

## 2012-10-23 MED ORDER — MORPHINE SULFATE 2 MG/ML IJ SOLN: 2.0000 mg | INTRAMUSCULAR | Status: DC | PRN

## 2012-10-23 MED ORDER — ASPIRIN EC 81 MG PO TBEC: 81.0000 mg | DELAYED_RELEASE_TABLET | Freq: Every day | ORAL | Status: DC

## 2012-10-23 MED ORDER — SODIUM CHLORIDE 0.9 % IJ SOLN: 3.0000 mL | INTRAMUSCULAR | Status: DC | PRN

## 2012-10-23 MED ORDER — SODIUM CHLORIDE 0.9 % IV BOLUS (SEPSIS)
1000.0000 mL | Freq: Once | INTRAVENOUS | Status: AC
  Administered 2012-10-23: 1000 mL via INTRAVENOUS

## 2012-10-23 MED ORDER — METOPROLOL TARTRATE 12.5 MG HALF TABLET
12.5000 mg | ORAL_TABLET | Freq: Two times a day (BID) | ORAL | Status: DC
  Administered 2012-10-23 – 2012-10-25 (×4): 12.5 mg via ORAL
  Filled 2012-10-23 (×5): qty 1

## 2012-10-23 MED ORDER — ASPIRIN 325 MG PO TABS
325.0000 mg | ORAL_TABLET | Freq: Once | ORAL | Status: AC
  Administered 2012-10-23: 325 mg via ORAL
  Filled 2012-10-23: qty 1

## 2012-10-23 MED ORDER — AMLODIPINE BESYLATE 10 MG PO TABS
10.0000 mg | ORAL_TABLET | Freq: Every day | ORAL | Status: DC
  Administered 2012-10-24 – 2012-10-25 (×2): 10 mg via ORAL
  Filled 2012-10-23 (×2): qty 1

## 2012-10-23 MED ORDER — ASPIRIN 81 MG PO CHEW
324.0000 mg | CHEWABLE_TABLET | ORAL | Status: AC
  Administered 2012-10-24: 324 mg via ORAL
  Filled 2012-10-23: qty 4

## 2012-10-23 MED ORDER — MORPHINE SULFATE 4 MG/ML IJ SOLN
4.0000 mg | Freq: Once | INTRAMUSCULAR | Status: AC
  Administered 2012-10-23: 4 mg via INTRAVENOUS
  Filled 2012-10-23: qty 1

## 2012-10-23 MED ORDER — INSULIN ASPART 100 UNIT/ML IV SOLN
10.0000 [IU] | Freq: Once | INTRAVENOUS | Status: AC
  Administered 2012-10-23: 10 [IU] via INTRAVENOUS
  Filled 2012-10-23: qty 0.1

## 2012-10-23 MED ORDER — INSULIN ASPART 100 UNIT/ML ~~LOC~~ SOLN: 0.0000 [IU] | Freq: Three times a day (TID) | SUBCUTANEOUS | Status: DC

## 2012-10-23 MED ORDER — SODIUM CHLORIDE 0.9 % IJ SOLN: 3.0000 mL | Freq: Two times a day (BID) | INTRAMUSCULAR | Status: DC

## 2012-10-23 MED ORDER — EPTIFIBATIDE 75 MG/100ML IV SOLN
2.0000 ug/kg/min | INTRAVENOUS | Status: DC
  Administered 2012-10-23 – 2012-10-24 (×3): 2 ug/kg/min via INTRAVENOUS
  Filled 2012-10-23 (×8): qty 100

## 2012-10-23 NOTE — H&P (Signed)
Debbie [name] is a 51 y.o. female  Admit date: 10/23/2012 Referring Physician:  Tressie [name] ED Primary Cardiologist:: Debbie Rank, MD Chief complaint / reason for admission:  Prolonged chest pain  HPI: Debbie [name] is a 62 year old with a 15 year history of diabetes mellitus who presents to the emergency room today with spontaneously occurring prolonged episodes of chest pressure. Though not as intense as during her ST elevation myocardial infarction in February, the discomfort is very similar in quality. She is currently pain free. In February she underwent emergency catheterization and had an ostial drug-eluting stent placed into the LAD from the left main. Since that time she has basically been sedentary, and stasis she lacks energy and the ability to do much physical activity. She began having chest pain approximately 2 weeks ago. She has not had any of her medications for approximately 2 weeks. She has not followed up with Debbie [name] since the acute myocardial infarction, according to her. She does not have a primary care physician to    PMH:    Past Medical History  Diagnosis Date  . Diabetes mellitus   . Hypertension   . Depression   . Anxiety   . Angina   . Asthma   . Arthritis   . Coronary artery disease     PSH:    Past Surgical History  Procedure Date  . Joint replacement 2011    left knee replacement  . Abdominal hysterectomy 2002  . Plate in cervical spine from herniated disc 2007  . Breast surgery     breast reduction 2002  . Carotid stent   . Coronary stent placement     ALLERGIES:   Review of patient's allergies indicates no known allergies.  Prior to Admit Meds:   (Not in a hospital admission) Family HX:   No family history on file. Social HX:    History   Social History  . Marital Status: Legally Separated    Spouse Name: N/A    Number of Children: N/A  . Years of Education: N/A   Occupational History  . Not on file.   Social History Main  Topics  . Smoking status: Current Every Day Smoker -- 1.0 packs/day    Types: Cigarettes  . Smokeless tobacco: Not on file  . Alcohol Use: Yes     Comment: occ  . Drug Use: No  . Sexually Active:    Other Topics Concern  . Not on file   Social History Narrative  . No narrative on file     ROS: The patient is tearful. She has difficulty making eye contact. She does not have a job. She states there is no ambition to do physical activities. 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 She has not worked since that time. She continues to smoke cigarettes.  Physical Exam: Blood pressure 151/79, pulse 73, temperature 98.7 F (37.1 C), temperature source Oral, resp. rate 18, SpO2 97.00%.   The patient is a young-appearing obese African American female who is in no acute distress. She has a bland affect.  Neck exam reveals no JVD. She is sitting at 90. No carotid bruits are heard.  The lungs are clear without wheezes. No rales are heard. Breath sounds are diminished at the bases.  Cardiac exam is normal without a murmur, click, rub, or gallop.  Abdomen is soft and nontender. Bowel sounds are normal.  Extremities reveal  no edema. The right posterior tibial pulses 1-2+. Left posterior tibials 2+. No femoral bruits are heard. Is no peripheral edema.  Neurological exam is grossly intact without evidence of motor sensory deficits. The patient is tearful and appears depressed to Labs:   Lab Results  Component Value Date   WBC 5.8 10/23/2012   HGB 13.1 10/23/2012   HCT 39.1 10/23/2012   MCV 95.4 10/23/2012   PLT 194 10/23/2012    Lab 10/23/12 1359  NA 133*  K 4.0  CL 98  CO2 23  BUN 15  CREATININE 0.73  CALCIUM 8.7  PROT --  BILITOT --  ALKPHOS --  ALT --  AST --  GLUCOSE 528*   Lab Results  Component Value Date   CKTOTAL 1266* 01/15/2012   CKMB 63.4* 01/15/2012   TROPONINI >25.00* 01/15/2012     Radiology:  No evidence of  heart failure. Cardiac silhouette is normal in size  EKG:  Normal sinus rhythm with poor R wave progression V1 through V5.  ASSESSMENT:   1. Non-ST elevation myocardial infarction likely related to restenosis/subacute stent thrombosis.  2. Poorly controlled diabetes mellitus  3. Hypertension  Plan:  1. IV heparin  2. Aspirin  3. IV nitroglycerin  4. Integrilin therapy. I will withhold thiamine appeared being therapy until after catheterization to determine if the best approach his repeat intervention or LIMA to LAD.   5. Internal medicine consultation to help with management of diabetes.   Debbie [name] 10/23/2012 4:59 PM

## 2012-10-23 NOTE — ED Provider Notes (Signed)
History     CSN: [id number]  Arrival date & time 10/23/12  1235   First MD Initiated Contact with Patient 10/23/12 1347      No chief complaint on file.   (Consider location/radiation/quality/duration/timing/severity/associated sxs/prior treatment) HPI Comments: 52 year old female with a history of diabetes, hypertension, CAD (STEMI with stent placement on 01/17/2012, cardiologist Dr. Eldridge Dace) presents emergency department with a chief complaint of chest pain.  Pain is located substernally and described as a heavy pressure that is rated at a 5/10 in severity and.  Onset of symptoms began the day before yesterday and were described as intermittent with exertion, however today the pressure has been constant.  Pain does not radiate.  Associated symptoms include shortness of breath and nausea, but patient denies diaphoresis.  No known alleviating factors.  Patient states that she was noncompliant with her followup appointment after stent placement but has been taking her medications up until a week ago when she ran out.    The history is provided by the patient.    Past Medical History  Diagnosis Date  . Diabetes mellitus   . Hypertension   . Depression   . Anxiety   . Angina   . Asthma   . Arthritis   . Coronary artery disease     Past Surgical History  Procedure Date  . Joint replacement 2011    left knee replacement  . Abdominal hysterectomy 2002  . Plate in cervical spine from herniated disc 2007  . Breast surgery     breast reduction 2002  . Carotid stent   . Coronary stent placement     No family history on file.  History  Substance Use Topics  . Smoking status: Current Every Day Smoker -- 1.0 packs/day    Types: Cigarettes  . Smokeless tobacco: Not on file  . Alcohol Use: Yes     Comment: occ    OB History    Grav Para Term Preterm Abortions TAB SAB Ect Mult Living                  Review of Systems  Constitutional: Positive for activity change. Negative  for fever, chills, diaphoresis, fatigue and unexpected weight change.  HENT: Negative for congestion, neck pain and neck stiffness.   Eyes: Negative for visual disturbance.  Respiratory: Positive for chest tightness and shortness of breath. Negative for apnea, cough, wheezing and stridor.   Cardiovascular: Positive for chest pain. Negative for palpitations and leg swelling.  Gastrointestinal: Positive for nausea. Negative for vomiting, abdominal pain, diarrhea and blood in stool.  Genitourinary: Negative for dysuria, urgency, hematuria and flank pain.  Musculoskeletal: Negative for myalgias, back pain and gait problem.  Skin: Negative for pallor.  Neurological: Negative for dizziness, syncope, light-headedness and headaches.  All other systems reviewed and are negative.    Allergies  Review of patient's allergies indicates no known allergies.  Home Medications   Current Outpatient Rx  Name  Route  Sig  Dispense  Refill  . AMLODIPINE BESYLATE 10 MG PO TABS   Oral   Take 10 mg by mouth daily.         . ASPIRIN 81 MG PO CHEW   Oral   Chew 81 mg by mouth daily.         . ATORVASTATIN CALCIUM 20 MG PO TABS   Oral   Take 20 mg by mouth daily at 6 PM.         . GLIPIZIDE-METFORMIN HCL 5-500  MG PO TABS   Oral   Take 1 tablet by mouth 2 (two) times daily before a meal.         . LISINOPRIL 5 MG PO TABS   Oral   Take 5 mg by mouth daily.         Marland Kitchen METOPROLOL TARTRATE 12.5 MG HALF TABLET   Oral   Take 12.5 mg by mouth 2 (two) times daily.         Marland Kitchen NITROGLYCERIN 0.4 MG SL SUBL   Sublingual   Place 0.4 mg under the tongue every 5 (five) minutes x 2 doses as needed. For chest pain         . PRASUGREL HCL 10 MG PO TABS   Oral   Take 10 mg by mouth daily.           BP 105/90  Pulse 78  Temp 98.7 F (37.1 C) (Oral)  Resp 18  SpO2 100%  Physical Exam  Nursing note and vitals reviewed. Constitutional: She appears well-developed and well-nourished. No  distress.  HENT:  Head: Normocephalic and atraumatic.  Eyes: Conjunctivae normal and EOM are normal. Pupils are equal, round, and reactive to light.  Neck: Normal range of motion. Neck supple. Normal carotid pulses and no JVD present. Carotid bruit is not present. No rigidity. Normal range of motion present.  Cardiovascular: Normal rate, regular rhythm, S1 normal, S2 normal, normal heart sounds, intact distal pulses and normal pulses.  Exam reveals no gallop and no friction rub.   No murmur heard.      No pitting edema bilaterally, RRR, no aberrant sounds on auscultations, distal pulses intact, no carotid bruit or JVD.   Pulmonary/Chest: Effort normal and breath sounds normal. No accessory muscle usage or stridor. No respiratory distress. She exhibits no tenderness and no bony tenderness.  Abdominal: Bowel sounds are normal.       Soft non tender. Non pulsatile aorta.   Skin: Skin is warm, dry and intact. No rash noted. She is not diaphoretic. No cyanosis. Nails show no clubbing.    ED Course  Procedures (including critical care time)  Labs Reviewed  BASIC METABOLIC PANEL - Abnormal; Notable for the following:    Sodium 133 (*)     Glucose, Bld 528 (*)     All other components within normal limits  CBC WITH DIFFERENTIAL  POCT I-STAT TROPONIN I   No results found.   No diagnosis found.   Date: 10/23/2012  Rate 79  Rhythm: normal sinus rhythm  QRS Axis: normal  Intervals: normal  ST/T Wave abnormalities: nonspecific T wave changes  Conduction Disutrbances:none  Narrative Interpretation: old infarct   Old EKG Reviewed: changes noted   MDM  Concern for cardiac etiology of Chest Pain. Cardiology has been consulted and will see patient in the ED for likely admit. Pt does not meet criteria for CP protocol and a further evaluation is recommended. Pt has been re-evaluated prior to consult and VSS, NAD, heart RRR, pain 0/10, lungs CTAB. No acute abnormalities found on EKG and first  round of cardiac enzymes negative. IN addition pt was found to be hyperglycemic and given IVF, insulin & placed on glucose stabilizer. This case was discussed with Dr. Ignacia Palma who has seen the patient and agrees with plan to admit.          Debbie [name], [place] 10/23/12 1610

## 2012-10-23 NOTE — ED Notes (Signed)
Pt presents with 1 week h/o mid-sternal chest pain that has been intermittent until yesterday.  Pt reports since yesterday, pain has been constant and does not radiate.  +shortness of breath, -nausea.  Last MI was in March with stent placed.

## 2012-10-23 NOTE — ED Provider Notes (Signed)
Medical screening examination/treatment/procedure(s) were conducted as a shared visit with non-physician practitioner(s) and myself.  I personally evaluated the patient during the encounter Pt with prior Hx CAD with stent earlier this year had been off meds for about a week and now has chest pain.  EKG and Enzymes negative but Hx of CAD and chest pain are ominous. I called Verdis Prime, M.D., [facility] cardiologist, who saw and admitted pt for chest pain workup and probable cardiac cath.  Carleene Cooper III, MD 10/23/12 [phone]

## 2012-10-23 NOTE — Progress Notes (Signed)
ANTICOAGULATION CONSULT NOTE - Initial Consult  Pharmacy Consult for heparin Indication: chest pain/ACS  No Known Allergies  Patient Measurements:   Heparin Dosing Weight: 80  Vital Signs: Temp: 98.7 F (37.1 C) (12/05 1240) Temp src: Oral (12/05 1240) BP: 131/64 mmHg (12/05 1730) Pulse Rate: 71  (12/05 1730)  Labs:  [facility] 10/23/12 1359  HGB 13.1  HCT 39.1  PLT 194  APTT --  LABPROT --  INR --  HEPARINUNFRC --  CREATININE 0.73  CKTOTAL --  CKMB --  TROPONINI --    The CrCl is unknown because both a height and weight (above a minimum accepted value) are required for this calculation.   Medical History: Past Medical History  Diagnosis Date  . Diabetes mellitus   . Hypertension   . Depression   . Anxiety   . Angina   . Asthma   . Arthritis   . Coronary artery disease     Medications:   (Not in a hospital admission) Scheduled:    . [COMPLETED] aspirin  325 mg Oral Once  . [COMPLETED] insulin aspart  10 Units Intravenous Once  . [COMPLETED]  morphine injection  4 mg Intravenous Once  . [COMPLETED] ondansetron (ZOFRAN) IV  4 mg Intravenous Once  . [DISCONTINUED] nitroGLYCERIN  1 inch Topical Once    Assessment: 51 yo with a hx CAD who was admitted for CP. IV heparin/integrilin have been order for MI.   Goal of Therapy:  Heparin level 0.3-0.5 Monitor platelets by anticoagulation protocol: Yes   Plan:  Heparin 4000 units bolus Heparin drip at 1150 units/hr 6hr heparin level Integrilin per protocol  Ulyses Southward [place] 10/23/2012,6:16 PM

## 2012-10-24 ENCOUNTER — Encounter (HOSPITAL_COMMUNITY)
Admission: EM | Disposition: A | Discharge status: Home / Self Care | Payer: Self-pay | Source: Home / Self Care | Attending: Interventional Cardiology

## 2012-10-24 ENCOUNTER — Encounter (HOSPITAL_COMMUNITY): Payer: Self-pay | Admitting: Surgery

## 2012-10-24 HISTORY — PX: LEFT HEART CATHETERIZATION WITH CORONARY ANGIOGRAM: SHX5451

## 2012-10-24 LAB — PROTIME-INR: INR: 0.99 (ref 0.00–1.49)

## 2012-10-24 LAB — HEMOGLOBIN A1C
Hgb A1c MFr Bld: 13.4 % — ABNORMAL HIGH (ref <5.7)
Mean Plasma Glucose: 338 mg/dL — ABNORMAL HIGH (ref <117)

## 2012-10-24 LAB — LIPID PANEL
Cholesterol: 185 mg/dL (ref 0–200)
HDL: 35 mg/dL — ABNORMAL LOW (ref >39)
Total CHOL/HDL Ratio: 5.3 RATIO
Triglycerides: 147 mg/dL (ref <150)

## 2012-10-24 LAB — GLUCOSE, CAPILLARY
Glucose-Capillary: 270 mg/dL — ABNORMAL HIGH (ref 70–99)
Glucose-Capillary: 158 mg/dL — ABNORMAL HIGH (ref 70–99)
Glucose-Capillary: 142 mg/dL — ABNORMAL HIGH (ref 70–99)
Glucose-Capillary: 156 mg/dL — ABNORMAL HIGH (ref 70–99)
Glucose-Capillary: 197 mg/dL — ABNORMAL HIGH (ref 70–99)
Glucose-Capillary: 195 mg/dL — ABNORMAL HIGH (ref 70–99)
Glucose-Capillary: 101 mg/dL — ABNORMAL HIGH (ref 70–99)
Glucose-Capillary: 260 mg/dL — ABNORMAL HIGH (ref 70–99)

## 2012-10-24 LAB — CBC
HCT: 34.8 % — ABNORMAL LOW (ref 36.0–46.0)
MCH: 30.8 pg (ref 26.0–34.0)
MCHC: 32.5 g/dL (ref 30.0–36.0)
MCV: 94.8 fL (ref 78.0–100.0)
Platelets: 214 10*3/uL (ref 150–400)
RDW: 13 % (ref 11.5–15.5)
WBC: 6.6 10*3/uL (ref 4.0–10.5)

## 2012-10-24 LAB — HEPARIN LEVEL (UNFRACTIONATED)
Heparin Unfractionated: 0.17 IU/mL — ABNORMAL LOW (ref 0.30–0.70)
Heparin Unfractionated: 0.61 IU/mL (ref 0.30–0.70)

## 2012-10-24 LAB — TSH: TSH: 3.477 u[IU]/mL (ref 0.350–4.500)

## 2012-10-24 SURGERY — LEFT HEART CATHETERIZATION WITH CORONARY ANGIOGRAM
Anesthesia: LOCAL

## 2012-10-24 MED ORDER — INSULIN ASPART 100 UNIT/ML ~~LOC~~ SOLN: 0.0000 [IU] | Freq: Three times a day (TID) | SUBCUTANEOUS | Status: DC

## 2012-10-24 MED ORDER — PRASUGREL HCL 10 MG PO TABS
10.0000 mg | ORAL_TABLET | Freq: Every day | ORAL | Status: DC
  Administered 2012-10-25: 10 mg via ORAL
  Filled 2012-10-24 (×2): qty 1

## 2012-10-24 MED ORDER — NITROGLYCERIN IN D5W 200-5 MCG/ML-% IV SOLN: 10.0000 ug/min | INTRAVENOUS | Status: DC

## 2012-10-24 MED ORDER — HEPARIN BOLUS VIA INFUSION
2000.0000 [IU] | Freq: Once | INTRAVENOUS | Status: AC
  Administered 2012-10-24: 2000 [IU] via INTRAVENOUS
  Filled 2012-10-24: qty 2000

## 2012-10-24 MED ORDER — VERAPAMIL HCL 2.5 MG/ML IV SOLN
INTRAVENOUS | Status: AC
  Filled 2012-10-24: qty 2

## 2012-10-24 MED ORDER — MIDAZOLAM HCL 2 MG/2ML IJ SOLN
INTRAMUSCULAR | Status: AC
  Filled 2012-10-24: qty 2

## 2012-10-24 MED ORDER — INSULIN ASPART 100 UNIT/ML ~~LOC~~ SOLN
0.0000 [IU] | Freq: Three times a day (TID) | SUBCUTANEOUS | Status: DC
  Administered 2012-10-24: 18:00:00 8 [IU] via SUBCUTANEOUS
  Administered 2012-10-25: 09:00:00 5 [IU] via SUBCUTANEOUS

## 2012-10-24 MED ORDER — ONDANSETRON HCL 4 MG/2ML IJ SOLN: 4.0000 mg | Freq: Four times a day (QID) | INTRAMUSCULAR | Status: DC | PRN

## 2012-10-24 MED ORDER — INSULIN GLARGINE 100 UNIT/ML ~~LOC~~ SOLN
10.0000 [IU] | SUBCUTANEOUS | Status: DC
  Administered 2012-10-24: 10 [IU] via SUBCUTANEOUS

## 2012-10-24 MED ORDER — INSULIN ASPART 100 UNIT/ML ~~LOC~~ SOLN
2.0000 [IU] | SUBCUTANEOUS | Status: DC
  Administered 2012-10-24 (×2): 4 [IU] via SUBCUTANEOUS

## 2012-10-24 MED ORDER — LIDOCAINE HCL (PF) 1 % IJ SOLN
INTRAMUSCULAR | Status: AC
  Filled 2012-10-24: qty 30

## 2012-10-24 MED ORDER — PRASUGREL HCL 10 MG PO TABS
ORAL_TABLET | ORAL | Status: AC
  Filled 2012-10-24: qty 6

## 2012-10-24 MED ORDER — SODIUM CHLORIDE 0.9 % IV SOLN
INTRAVENOUS | Status: AC
  Administered 2012-10-24: 18:00:00 via INTRAVENOUS

## 2012-10-24 MED ORDER — ACETAMINOPHEN 325 MG PO TABS: 650.0000 mg | ORAL_TABLET | ORAL | Status: DC | PRN

## 2012-10-24 MED ORDER — HEPARIN (PORCINE) IN NACL 2-0.9 UNIT/ML-% IJ SOLN
INTRAMUSCULAR | Status: AC
  Filled 2012-10-24: qty 1000

## 2012-10-24 MED ORDER — NITROGLYCERIN 0.2 MG/ML ON CALL CATH LAB
INTRAVENOUS | Status: AC
  Filled 2012-10-24: qty 1

## 2012-10-24 MED ORDER — SODIUM CHLORIDE 0.9 % IJ SOLN: 3.0000 mL | Freq: Two times a day (BID) | INTRAMUSCULAR | Status: DC

## 2012-10-24 MED ORDER — HEPARIN SODIUM (PORCINE) 1000 UNIT/ML IJ SOLN
INTRAMUSCULAR | Status: AC
  Filled 2012-10-24: qty 1

## 2012-10-24 MED ORDER — ASPIRIN 81 MG PO CHEW
81.0000 mg | CHEWABLE_TABLET | Freq: Every day | ORAL | Status: DC
  Administered 2012-10-25: 81 mg via ORAL
  Filled 2012-10-24: qty 1

## 2012-10-24 MED ORDER — DIAZEPAM 5 MG PO TABS
5.0000 mg | ORAL_TABLET | Freq: Once | ORAL | Status: AC
  Administered 2012-10-24: 5 mg via ORAL
  Filled 2012-10-24: qty 1

## 2012-10-24 MED ORDER — GLIPIZIDE 5 MG PO TABS
5.0000 mg | ORAL_TABLET | Freq: Two times a day (BID) | ORAL | Status: DC
  Administered 2012-10-24 – 2012-10-25 (×2): 5 mg via ORAL
  Filled 2012-10-24 (×4): qty 1

## 2012-10-24 MED ORDER — FENTANYL CITRATE 0.05 MG/ML IJ SOLN
INTRAMUSCULAR | Status: AC
  Filled 2012-10-24: qty 2

## 2012-10-24 MED ORDER — SODIUM CHLORIDE 0.9 % IJ SOLN: 3.0000 mL | INTRAMUSCULAR | Status: DC | PRN

## 2012-10-24 NOTE — Progress Notes (Signed)
Notified Dr Eldridge Dace that patient is confused and complains of headache. Nitroglycerin dc'd. Neuros all within normal limits. No visual changes, no weakness or numbness, normal strength, moving all extremities well. Complained of dizziness when out of bed, resolved when resting in bed. Oriented to self, name, birth date, children. Remembers Dr. Eldridge Dace. Emotional support given and reassured that some of the sedation could cause these symptoms. Patient responds well to support and states shes still nervous. Frequent observation. Started normal saline infusion at 100 mls/hr as ordered by Dr. Eldridge Dace. Voiding large quantities of urine. Steady gait when up to bathroom.

## 2012-10-24 NOTE — Progress Notes (Signed)
Patient complains of nausea after eating 30% of meal. Returned to bed and resting. Still feeling anxious and confused. Frequent observation. No other changes in assesment

## 2012-10-24 NOTE — Care Management Note (Signed)
    Page 1 of 1   10/24/2012     8:23:13 AM   CARE MANAGEMENT NOTE 10/24/2012  Patient:  Debbie [name],Debbie [name]   Account Number:  [id number]  Date Initiated:  10/24/2012  Documentation initiated by:  [facility]  Subjective/Objective Assessment:   NSTEMI  Lives with daughter.     Action/Plan:   Anticipated DC Date:  10/28/2012   Anticipated DC Plan:  HOME W HOME HEALTH SERVICES      DC Planning Services  CM consult      Choice offered to / List presented to:             Status of service:  In process, will continue to follow Medicare Important Message given?   (If response is "NO", the following Medicare IM given date fields will be blank) Date Medicare IM given:   Date Additional Medicare IM given:    Discharge Disposition:    Per UR Regulation:  Reviewed for med. necessity/level of care/duration of stay  If discussed at Long Length of Stay Meetings, dates discussed:    Comments:  Contact:  10-24-12 8:20am Avie Arenas, RNBSN [phone] System showing Medicare insurance - note in H&P states has no insurance.  CM will continue to follow for discharge needs.

## 2012-10-24 NOTE — Progress Notes (Signed)
ANTICOAGULATION CONSULT NOTE - Follow Up Consult  Pharmacy Consult for Heparin and Integrilin Indication: chest pain/ACS  No Known Allergies  Patient Measurements: Height: 5\' 6"  (167.6 cm) Weight: 202 lb 2.6 oz (91.7 kg) IBW/kg (Calculated) : 59.3  Heparin Dosing Weight: 80kg  Vital Signs: Temp: 97.9 F (36.6 C) (12/06 0739) Temp src: Oral (12/06 0739) BP: 123/68 mmHg (12/06 1000) Pulse Rate: 73  (12/06 1000)  Labs:  Basename 10/24/12 0939 10/24/12 0200 10/23/12 2032 10/23/12 1359  HGB -- 11.3* 12.4 --  HCT -- 34.8* 38.1 39.1  PLT -- 214 211 194  APTT -- -- -- --  LABPROT -- 13.0 -- --  INR -- 0.99 -- --  HEPARINUNFRC 0.61 0.17* -- --  CREATININE -- -- 0.60 0.73  CKTOTAL -- -- -- --  CKMB -- -- -- --  TROPONINI -- <0.30 <0.30 --    Estimated Creatinine Clearance: 93.9 ml/min (by C-G formula based on Cr of 0.6).   Medications:  Heparin @ 1400 units/hr - turned off for cath today Integrilin @ 69mcg/kg/min  Assessment: 51yof started on heparin and integrilin for NSTEMI - likely due to restenosis/subacute stent thrombosis. Heparin level this morning is slightly above goal at 0.61. Heparin has already been turned off since patient going to cath today.  Continues on integrilin. No bleeding reported by the patient. CBC stable.  Goal of Therapy:  Heparin level 0.3-0.5 units/ml Monitor platelets by anticoagulation protocol: Yes   Plan:  1) Follow up after cath today  Fredrik Rigger 10/24/2012,10:53 AM

## 2012-10-24 NOTE — CV Procedure (Addendum)
PROCEDURE:  Left heart catheterization with selective coronary angiography, left ventriculogram.  INDICATIONS:  Unstable angina  The risks, benefits, and details of the procedure were explained to the patient.  The patient verbalized understanding and wanted to proceed.  Informed written consent was obtained.  PROCEDURE TECHNIQUE:  After Xylocaine anesthesia a 20F sheath was placed in the right radial artery with a single anterior needle wall stick.   Left coronary angiography was done using a Judkins L3.5 guide catheter.  Right coronary angiography was done using a Judkins R4 guide catheter.  Left ventriculography was done using a pigtail catheter.    CONTRAST:  Total of 90 cc.  COMPLICATIONS:  None.    HEMODYNAMICS:  Aortic pressure was 123/71 ; LV pressure was 117/15; LVEDP 24.  There was no gradient between the left ventricle and aorta.    ANGIOGRAPHIC DATA:   The left main coronary artery is a long vessel that is widely patent.  The left anterior descending artery is a large vessel to the apex.  The proximal stent is widely patent.  There is mild atherosclerosis in the mid LAD.  There is some proximal in stent restenosis, up to 30% at the ostium. The D1 is small with a proximal 50% lesion.  The left circumflex artery is a large vessel.  There is a small OM1 and a large OM2, both of which were widely patent.  There is a large ramus vessel which is angiographically normal.  The right coronary artery is a large dominant vessel which is widely patent.  The PDA has mild proximal disease.  LEFT VENTRICULOGRAM:  Left ventricular angiogram was done in the 30 RAO projection and revealed a small area of apical akinesis and overall normal systolic function with an estimated ejection fraction of 55%.  LVEDP was 24 mmHg.  IMPRESSIONS:  1. Patent left main coronary artery. 2. Patent ostial  left anterior descending artery stent with mild instent restenosis, up to 30%.  3. Patent left circumflex  artery and its branches. 4. Patent right coronary artery. 5. Small area of apical akinesis.  Overall normal left ventricular systolic function.  LVEDP 24 mmHg.  Ejection fraction 55%.  RECOMMENDATION:  Resume dual antiplatelet therapy.  She needs aggressive risk factor modification including diabetes control.  She will be moving to [place] permanently to live with her daughter.  It would be reasonable to pursue periodic stress testing with a cardiologist closer to home.  Will try to diurese somewhat as well.    Had a long discussion about importance of meds and diet control for diabetes and heart disease.  SHe does have some family in [place], 3 children, who can help her.  She knows she has not been following her diet.  She is willing to improve her habits.  She is hoping to go home Saturday.  She now has the money to get her medicines.  We did not have samples in our office to give her before discharge. Dr. Donnie Aho will be available for discharging patient over the weekend.

## 2012-10-24 NOTE — Progress Notes (Signed)
Inpatient Diabetes Program Recommendations  AACE/ADA: New Consensus Statement on Inpatient Glycemic Control (2013)  Target Ranges:  Prepandial:   less than 140 mg/dL      Peak postprandial:   less than 180 mg/dL (1-2 hours)      Critically ill patients:  140 - 180 mg/dL   Reason for Visit: Address A1C of 13.4%  Note: Spoke with pt about diabetes.  Discussed A1C results with her and explained what an A1C is, basic pathophysiology of DM Type 2, basic home care, importance of checking CBGs and maintaining good CBG control to prevent long-term and short-term complications. According to the patient's chart, patient has not been taking medications for last 2 weeks.  In talking with the patient, she states that she had Glipizide-Metformin 5-500mg  and that she had been taking it as ordered but that she had ran out of her blood pressure medications because she did not have any refills left.  Patient states that she was going to the [facility] and she does not have a primary doctor.  Patient states that she would like to find a primary doctor.  Therefore, informed Avie Arenas, RN, Case Manager of request.  When asked about whether she was checking her blood sugars at home she stated that she was not because her "machine is broken".  Therefore, she needs a glucose meter prescription at time of discharge.    Also talked with patient about potential of being discharged on insulin because of A1C of 13.4% and patient states "No.  I refuse to go home on insulin.  I will not take it.  I am afraid of needles and I am not going to start using insulin".  Informed patient that I would make a note in the chart to reflect her desire not to use insulin.   Will continue to follow.  Thanks, Orlando Penner, RN, BSN, CCRN Diabetes Coordinator Inpatient Diabetes Program [phone]

## 2012-10-24 NOTE — Progress Notes (Signed)
UR Completed.  Debbie [name] [phone] 10/24/2012  

## 2012-10-24 NOTE — Progress Notes (Signed)
Patient up to bathroom with steady gait. Stated nausea resolved. No further confusion. Visiting with family and stated she feels like herself again. 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 No complaints of headache. Resting quietly at this time.

## 2012-10-24 NOTE — Progress Notes (Addendum)
SUBJECTIVE:  Currently no chest pain.  SHe can get Effient for $3/month. She states that she ran out of money and the pharmacy would not give her the effient.  She is planning to move to [place] and will be leaving on Dec 10th.  She has a daughter who lives there.  OBJECTIVE:   Vitals:   Filed Vitals:   10/24/12 0900 10/24/12 1000 10/24/12 1100 10/24/12 1200  BP: 126/66 123/68 128/74 131/66  Pulse: 63 73 69 67  Temp:    98.3 F (36.8 C)  TempSrc:    Oral  Resp: 15   14  Height:      Weight:      SpO2:    98%   I&O's:   Intake/Output Summary (Last 24 hours) at 10/24/12 1242 Last data filed at 10/24/12 1200  Gross per 24 hour  Intake 1918.69 ml  Output    400 ml  Net 1518.69 ml   TELEMETRY: Reviewed telemetry pt in NSR:     PHYSICAL EXAM General: Well developed, well nourished, in no acute distress Head:    Normal cephalic and atramatic  Lungs:   Clear bilaterally to auscultation and percussion. Heart:  HRRR S1 S2             . No JVD.  Abdomen: , abdomen soft and non-tender Msk:  Back normal. Normal strength and tone for age. Extremities:   No  edema.  3+ right radial pulse Neuro: Alert and oriented X 3. Psych:  Flat affect, responds appropriately   LABS: Basic Metabolic Panel:  Basename 10/23/12 2032 10/23/12 1359  NA 140 133*  K 3.5 4.0  CL 105 98  CO2 25 23  GLUCOSE 135* 528*  BUN 13 15  CREATININE 0.60 0.73  CALCIUM 8.5 8.7  MG -- --  PHOS -- --   Liver Function Tests:  [facility] 10/23/12 2032  AST 11  ALT 7  ALKPHOS 76  BILITOT 0.2*  PROT 6.0  ALBUMIN 3.2*   No results found for this basename: LIPASE:2,AMYLASE:2 in the last 72 hours CBC:  Basename 10/24/12 0200 10/23/12 2032 10/23/12 1359  WBC 6.6 7.1 --  NEUTROABS -- 2.5 3.4  HGB 11.3* 12.4 --  HCT 34.8* 38.1 --  MCV 94.8 95.0 --  PLT 214 211 --   Cardiac Enzymes:  Basename 10/24/12 0200 10/23/12 2032  CKTOTAL -- --  CKMB -- --  CKMBINDEX -- --  TROPONINI <0.30 <0.30   BNP: No  components found with this basename: POCBNP:3 D-Dimer: No results found for this basename: DDIMER:2 in the last 72 hours Hemoglobin A1C:  Basename 10/23/12 2032  HGBA1C 13.4*   Fasting Lipid Panel:  Basename 10/24/12 0200  CHOL 185  HDL 35*  LDLCALC 121*  TRIG 147  CHOLHDL 5.3  LDLDIRECT --   Thyroid Function Tests:  Basename 10/23/12 2032  TSH 3.477  T4TOTAL --  T3FREE --  THYROIDAB --   Anemia Panel: No results found for this basename: VITAMINB12,FOLATE,FERRITIN,TIBC,IRON,RETICCTPCT in the last 72 hours Coag Panel:   Lab Results  Component Value Date   INR 0.99 10/24/2012   INR 0.85 01/15/2012   INR 0.9 10/11/2008    RADIOLOGY: Dg Chest Portable 1 View  10/23/2012  *RADIOLOGY REPORT*  Clinical Data: Chest pain for 2 days, smoking history  PORTABLE CHEST - 1 VIEW  Comparison: Portable chest x-ray of 04/12/2012  Findings: The lungs are clear.  The heart is stable in size.  No bony abnormality is seen.  IMPRESSION: Stable chest x-ray.  No active lung disease.   Original Report Authenticated By: Dwyane Dee, M.D.       ASSESSMENT: Unstable angina. Relieved with NTG IV. Enzymes negative.  H/o ostial LAD stent.    PLAN:  Plan for cath later today.  Stressed the importance of DAPT.  Will restart Effient as long as she does not have surgical disease.  Plan radial approach. Questions answered.  Corky Crafts., MD  10/24/2012  12:42 PM  Had a long discussion about importance of meds and diet control for diabetes and heart disease.  SHe does have some family in [place], 3 children, who can help her.  She knows she has not been following her diet.  She is willing to improve her habits.  She is hoping to go home Saturday. We did not have samples in our office to give her before discharge. She now has the money to get her medicines, specifically her Effient.   Dr. Donnie Aho will be available for discharging patient over the weekend.

## 2012-10-24 NOTE — Progress Notes (Signed)
ANTICOAGULATION CONSULT NOTE - Follow Up Consult  Pharmacy Consult for heparin Indication: chest pain/ACS  Labs:  [facility] 10/24/12 0200 10/23/12 2032 10/23/12 1359  HGB 11.3* 12.4 --  HCT 34.8* 38.1 39.1  PLT 214 211 194  APTT -- -- --  LABPROT 13.0 -- --  INR 0.99 -- --  HEPARINUNFRC 0.17* -- --  CREATININE -- 0.60 0.73  CKTOTAL -- -- --  CKMB -- -- --  TROPONINI -- <0.30 --    Assessment: 51yo female subtherapeutic on heparin with initial dosing for CP.  Goal of Therapy:  Heparin level 0.3-0.7 units/ml   Plan:  Will rebolus with heparin 2000 units x1 and increase gtt by 3 units/kg/hr to 1400 units/hr and check level in 6hr.  Colleen Can PharmD BCPS 10/24/2012,3:45 AM

## 2012-10-25 DIAGNOSIS — I2510 Atherosclerotic heart disease of native coronary artery without angina pectoris: Secondary | ICD-10-CM | POA: Diagnosis present

## 2012-10-25 LAB — CBC
HCT: 39.6 % (ref 36.0–46.0)
Hemoglobin: 13.1 g/dL (ref 12.0–15.0)
RBC: 4.17 MIL/uL (ref 3.87–5.11)
WBC: 5.9 10*3/uL (ref 4.0–10.5)

## 2012-10-25 LAB — GLUCOSE, CAPILLARY: Glucose-Capillary: 227 mg/dL — ABNORMAL HIGH (ref 70–99)

## 2012-10-25 MED ORDER — PRASUGREL HCL 10 MG PO TABS: 10.0000 mg | ORAL_TABLET | Freq: Every day | ORAL | Status: DC

## 2012-10-25 MED ORDER — GLIPIZIDE-METFORMIN HCL 5-500 MG PO TABS: 1.0000 | ORAL_TABLET | Freq: Two times a day (BID) | ORAL | Status: DC

## 2012-10-25 NOTE — Discharge Summary (Signed)
Patient ID: Debbie [name] MRN: [id number] DOB/AGE: [birth year] 52 y.o.  Admit date: 10/23/2012 Discharge date: 10/25/2012  Primary Discharge Diagnosis Chest pain  Secondary Discharge Diagnosis  CAD with history of LAD stent with 30% instent restenosis on cath 10/24/2012  DM  HTN  Depression/Anxiety  Asthma  Arthritis  Significant Diagnostic Studies: angiography: PROCEDURE: Left heart catheterization with selective coronary angiography, left ventriculogram.  INDICATIONS: Unstable angina  The risks, benefits, and details of the procedure were explained to the patient. The patient verbalized understanding and wanted to proceed. Informed written consent was obtained.  PROCEDURE TECHNIQUE: After Xylocaine anesthesia a 22F sheath was placed in the right radial artery with a single anterior needle wall stick. Left coronary angiography was done using a Judkins L3.5 guide catheter. Right coronary angiography was done using a Judkins R4 guide catheter. Left ventriculography was done using a pigtail catheter.  CONTRAST: Total of 90 cc.  COMPLICATIONS: None.  HEMODYNAMICS: Aortic pressure was 123/71 ; LV pressure was 117/15; LVEDP 24. There was no gradient between the left ventricle and aorta.  ANGIOGRAPHIC DATA: The left main coronary artery is a long vessel that is widely patent.  The left anterior descending artery is a large vessel to the apex. The proximal stent is widely patent. There is mild atherosclerosis in the mid LAD. There is some proximal in stent restenosis, up to 30% at the ostium. The D1 is small with a proximal 50% lesion.  The left circumflex artery is a large vessel. There is a small OM1 and a large OM2, both of which were widely patent. There is a large ramus vessel which is angiographically normal.  The right coronary artery is a large dominant vessel which is widely patent. The PDA has mild proximal disease.  LEFT VENTRICULOGRAM: Left ventricular angiogram was done in the 30 RAO  projection and revealed a small area of apical akinesis and overall normal systolic function with an estimated ejection fraction of 55%. LVEDP was 24 mmHg.  IMPRESSIONS:  1. Patent left main coronary artery. 2. Patent ostial left anterior descending artery stent with mild instent restenosis, up to 30%.  3. Patent left circumflex artery and its branches. 4. Patent right coronary artery. 5. Small area of apical akinesis. Overall normal left ventricular systolic function. LVEDP 24 mmHg. Ejection fraction 55%. RECOMMENDATION: Resume dual antiplatelet therapy. She needs aggressive risk factor modification including diabetes control. She will be moving to [place] permanently to live with her daughter. It would be reasonable to pursue periodic stress testing with a cardiologist closer to home. Will try to diurese somewhat as well.  Had a long discussion about importance of meds and diet control for diabetes and heart disease. SHe does have some family in [place], 3 children, who can help her. She knows she has not been following her diet. She is willing to improve her habits. She is hoping to go home Saturday. She now has the money to get her medicines. We did not have samples in our office to give her before discharge. Dr. Donnie Aho will be available for discharging patient over the weekend.   Consults: None  Hospital Course: Debbie [name] is a 51 year old with a 15 year history of diabetes mellitus who presents to the emergency room today with spontaneously occurring prolonged episodes of chest pressure. She was admitted and ruled out for MI with enzymes.  She underwent cardiac cath which revealed 30% instent restenosis in the LAD stent otherwise normal coronary arteries.  Post cath she has some confusion  and headache and NTG gtt was discontinued.  Neuro exam was normal.  On the am of discharge she was ambulating without difficulty and had no complaints.  She was discharged to home in stable  condition.    Discharge Exam: Blood pressure 130/73, pulse 70, temperature 98.6 F (37 C), temperature source Oral, resp. rate 17, height 5\' 6"  (1.676 m), weight 92.5 kg (203 lb 14.8 oz), SpO2 96.00%.   General appearance: alert Resp: clear to auscultation bilaterally Cardio: regular rate and rhythm, S1, S2 normal, no murmur, click, rub or gallop GI: soft, non-tender; bowel sounds normal; no masses,  no organomegaly Extremities: extremities normal, atraumatic, no cyanosis or edema.  Right radial artery intact with no hematoma and 2+ pulse Labs:   Lab Results  Component Value Date   WBC 5.9 10/25/2012   HGB 13.1 10/25/2012   HCT 39.6 10/25/2012   MCV 95.0 10/25/2012   PLT 213 10/25/2012    Lab 10/23/12 2032  NA 140  K 3.5  CL 105  CO2 25  BUN 13  CREATININE 0.60  CALCIUM 8.5  PROT 6.0  BILITOT 0.2*  ALKPHOS 76  ALT 7  AST 11  GLUCOSE 135*   Lab Results  Component Value Date   CKTOTAL 1266* 01/15/2012   CKMB 63.4* 01/15/2012   TROPONINI <0.30 10/24/2012    Lab Results  Component Value Date   CHOL 185 10/24/2012   CHOL 233* 01/16/2012   CHOL 227* 01/15/2012   Lab Results  Component Value Date   HDL 35* 10/24/2012   HDL 43 01/16/2012   HDL 44 1/61/0960   Lab Results  Component Value Date   LDLCALC 121* 10/24/2012   LDLCALC 115* 01/16/2012   LDLCALC UNABLE TO CALCULATE IF TRIGLYCERIDE OVER 400 mg/dL 4/54/0981   Lab Results  Component Value Date   TRIG 147 10/24/2012   TRIG 373* 01/16/2012   TRIG 521* 01/15/2012   Lab Results  Component Value Date   CHOLHDL 5.3 10/24/2012   CHOLHDL 5.4 01/16/2012   CHOLHDL 5.2 01/15/2012   No results found for this basename: LDLDIRECT    Radiology: *RADIOLOGY REPORT*  Clinical Data: Chest pain for 2 days, smoking history  PORTABLE CHEST - 1 VIEW  Comparison: Portable chest x-ray of 04/12/2012  Findings: The lungs are clear. The heart is stable in size. No  bony abnormality is seen.  IMPRESSION:  Stable chest x-ray. No active  lung disease.  Original Report Authenticated By: Dwyane Dee, M.D.  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      aspirin 81 MG chewable tablet   Chew 81 mg by mouth daily.      atorvastatin 20 MG tablet   Commonly known as: LIPITOR   Take 20 mg by mouth daily at 6 PM.      glipiZIDE-metformin 5-500 MG per tablet   Commonly known as: METAGLIP  Take 1 tablet by mouth 2 (two) times daily before a meal. Do not restart Glipizide-Metformin until Monday am 12/9      lisinopril 5 MG tablet   Commonly known as: PRINIVIL,ZESTRIL   Take 5 mg by mouth daily.      metoprolol tartrate 12.5 mg Tabs   Commonly known as: LOPRESSOR   Take 12.5 mg by mouth 2 (two) times daily.      nitroGLYCERIN 0.4 MG SL tablet   Commonly known as: NITROSTAT   Place 0.4 mg under the tongue every 5 (five) minutes x 2 doses as needed. For chest pain      prasugrel 10 MG Tabs   Commonly known as: EFFIENT   Take 10 mg by mouth daily.           Follow-up Information    Call FERGUSON,CYNTHIA A, NP. (call for a followup appointment with Dr. Hoyle Barr nurse practitioner in 2 weeks)    Contact information:   [facility] [address], [address] [place] [address] [phone]          BRING ALL MEDICATIONS WITH YOU TO FOLLOW UP APPOINTMENTS  Time spent with patient to include physician time:35 minutes Signed: Jai Bear R 10/25/2012, 9:07 AM

## 2013-03-16 ENCOUNTER — Inpatient Hospital Stay (HOSPITAL_COMMUNITY)
Admission: EM | Admit: 2013-03-16 | Discharge: 2013-03-17 | DRG: 313 | Disposition: A | Discharge status: Home / Self Care | Payer: Medicare Other | Attending: Interventional Cardiology | Admitting: Interventional Cardiology

## 2013-03-16 ENCOUNTER — Emergency Department (HOSPITAL_COMMUNITY): Payer: Medicare Other

## 2013-03-16 ENCOUNTER — Encounter (HOSPITAL_COMMUNITY): Payer: Self-pay | Admitting: *Deleted

## 2013-03-16 DIAGNOSIS — Z79899 Other long term (current) drug therapy: Secondary | ICD-10-CM

## 2013-03-16 DIAGNOSIS — I252 Old myocardial infarction: Secondary | ICD-10-CM

## 2013-03-16 DIAGNOSIS — R079 Chest pain, unspecified: Principal | ICD-10-CM

## 2013-03-16 DIAGNOSIS — F329 Major depressive disorder, single episode, unspecified: Secondary | ICD-10-CM | POA: Diagnosis present

## 2013-03-16 DIAGNOSIS — I2510 Atherosclerotic heart disease of native coronary artery without angina pectoris: Secondary | ICD-10-CM

## 2013-03-16 DIAGNOSIS — F3289 Other specified depressive episodes: Secondary | ICD-10-CM | POA: Diagnosis present

## 2013-03-16 DIAGNOSIS — I10 Essential (primary) hypertension: Secondary | ICD-10-CM

## 2013-03-16 DIAGNOSIS — F17200 Nicotine dependence, unspecified, uncomplicated: Secondary | ICD-10-CM | POA: Diagnosis present

## 2013-03-16 DIAGNOSIS — Z91199 Patient's noncompliance with other medical treatment and regimen due to unspecified reason: Secondary | ICD-10-CM

## 2013-03-16 DIAGNOSIS — Z9111 Patient's noncompliance with dietary regimen: Secondary | ICD-10-CM

## 2013-03-16 DIAGNOSIS — E119 Type 2 diabetes mellitus without complications: Secondary | ICD-10-CM | POA: Diagnosis present

## 2013-03-16 DIAGNOSIS — E872 Acidosis, unspecified: Secondary | ICD-10-CM

## 2013-03-16 DIAGNOSIS — I200 Unstable angina: Secondary | ICD-10-CM

## 2013-03-16 DIAGNOSIS — Z7982 Long term (current) use of aspirin: Secondary | ICD-10-CM

## 2013-03-16 DIAGNOSIS — G8929 Other chronic pain: Secondary | ICD-10-CM | POA: Diagnosis present

## 2013-03-16 DIAGNOSIS — E1159 Type 2 diabetes mellitus with other circulatory complications: Secondary | ICD-10-CM | POA: Diagnosis present

## 2013-03-16 DIAGNOSIS — Z9114 Patient's other noncompliance with medication regimen: Secondary | ICD-10-CM

## 2013-03-16 DIAGNOSIS — Z91148 Patient's other noncompliance with medication regimen for other reason: Secondary | ICD-10-CM

## 2013-03-16 DIAGNOSIS — E785 Hyperlipidemia, unspecified: Secondary | ICD-10-CM | POA: Diagnosis present

## 2013-03-16 DIAGNOSIS — F411 Generalized anxiety disorder: Secondary | ICD-10-CM | POA: Diagnosis present

## 2013-03-16 DIAGNOSIS — E669 Obesity, unspecified: Secondary | ICD-10-CM

## 2013-03-16 DIAGNOSIS — Z9861 Coronary angioplasty status: Secondary | ICD-10-CM

## 2013-03-16 DIAGNOSIS — Z9119 Patient's noncompliance with other medical treatment and regimen: Secondary | ICD-10-CM

## 2013-03-16 DIAGNOSIS — M25569 Pain in unspecified knee: Secondary | ICD-10-CM | POA: Diagnosis present

## 2013-03-16 HISTORY — DX: Old myocardial infarction: I25.2

## 2013-03-16 HISTORY — DX: Obesity, unspecified: E66.9

## 2013-03-16 LAB — BASIC METABOLIC PANEL
CO2: 23 mEq/L (ref 19–32)
Chloride: 100 mEq/L (ref 96–112)
Creatinine, Ser: 0.68 mg/dL (ref 0.50–1.10)
GFR calc Af Amer: >90 mL/min (ref >90)
Potassium: 3.9 mEq/L (ref 3.5–5.1)
Sodium: 137 mEq/L (ref 135–145)

## 2013-03-16 LAB — COMPREHENSIVE METABOLIC PANEL
ALT: 8 U/L (ref 0–35)
AST: 15 U/L (ref 0–37)
Alkaline Phosphatase: 73 U/L (ref 39–117)
CO2: 21 mEq/L (ref 19–32)
GFR calc Af Amer: >90 mL/min (ref >90)
Glucose, Bld: 197 mg/dL — ABNORMAL HIGH (ref 70–99)
Potassium: 3.8 mEq/L (ref 3.5–5.1)
Sodium: 136 mEq/L (ref 135–145)
Total Protein: 7.5 g/dL (ref 6.0–8.3)

## 2013-03-16 LAB — TROPONIN I: Troponin I: <0.3 ng/mL (ref <0.30)

## 2013-03-16 LAB — CBC
Hemoglobin: 16.1 g/dL — ABNORMAL HIGH (ref 12.0–15.0)
Platelets: 248 10*3/uL (ref 150–400)
RBC: 5.04 MIL/uL (ref 3.87–5.11)
WBC: 6.2 10*3/uL (ref 4.0–10.5)

## 2013-03-16 LAB — POCT I-STAT TROPONIN I

## 2013-03-16 LAB — GLUCOSE, CAPILLARY: Glucose-Capillary: 193 mg/dL — ABNORMAL HIGH (ref 70–99)

## 2013-03-16 LAB — PRO B NATRIURETIC PEPTIDE: Pro B Natriuretic peptide (BNP): 97.7 pg/mL (ref 0–125)

## 2013-03-16 LAB — PROTIME-INR: INR: 1.04 (ref 0.00–1.49)

## 2013-03-16 MED ORDER — SODIUM CHLORIDE 0.9 % IJ SOLN: 3.0000 mL | INTRAMUSCULAR | Status: DC | PRN

## 2013-03-16 MED ORDER — HEPARIN BOLUS VIA INFUSION
4000.0000 [IU] | Freq: Once | INTRAVENOUS | Status: AC
  Administered 2013-03-16: 4000 [IU] via INTRAVENOUS

## 2013-03-16 MED ORDER — METFORMIN HCL 500 MG PO TABS
500.0000 mg | ORAL_TABLET | Freq: Two times a day (BID) | ORAL | Status: DC
  Administered 2013-03-17: 500 mg via ORAL
  Filled 2013-03-16 (×3): qty 1

## 2013-03-16 MED ORDER — GLIPIZIDE 5 MG PO TABS
5.0000 mg | ORAL_TABLET | Freq: Two times a day (BID) | ORAL | Status: DC
  Filled 2013-03-16 (×3): qty 1

## 2013-03-16 MED ORDER — ASPIRIN 81 MG PO CHEW
81.0000 mg | CHEWABLE_TABLET | Freq: Every day | ORAL | Status: DC
  Administered 2013-03-17: 81 mg via ORAL
  Filled 2013-03-16 (×2): qty 1

## 2013-03-16 MED ORDER — ONDANSETRON HCL 4 MG/2ML IJ SOLN: 4.0000 mg | Freq: Four times a day (QID) | INTRAMUSCULAR | Status: DC | PRN

## 2013-03-16 MED ORDER — SODIUM CHLORIDE 0.9 % IJ SOLN: 3.0000 mL | Freq: Two times a day (BID) | INTRAMUSCULAR | Status: DC

## 2013-03-16 MED ORDER — NITROGLYCERIN 2 % TD OINT
1.0000 [in_us] | TOPICAL_OINTMENT | Freq: Once | TRANSDERMAL | Status: AC
  Administered 2013-03-16: 1 [in_us] via TOPICAL
  Filled 2013-03-16: qty 1

## 2013-03-16 MED ORDER — HEPARIN (PORCINE) IN NACL 100-0.45 UNIT/ML-% IJ SOLN
1300.0000 [IU]/h | INTRAMUSCULAR | Status: DC
  Administered 2013-03-16: 1300 [IU]/h via INTRAVENOUS
  Filled 2013-03-16 (×2): qty 250

## 2013-03-16 MED ORDER — NITROGLYCERIN 0.4 MG SL SUBL: 0.4000 mg | SUBLINGUAL_TABLET | SUBLINGUAL | Status: DC | PRN

## 2013-03-16 MED ORDER — CLOPIDOGREL BISULFATE 75 MG PO TABS
75.0000 mg | ORAL_TABLET | Freq: Every day | ORAL | Status: DC
  Administered 2013-03-16: 75 mg via ORAL
  Filled 2013-03-16 (×2): qty 1

## 2013-03-16 MED ORDER — INSULIN ASPART 100 UNIT/ML ~~LOC~~ SOLN
0.0000 [IU] | Freq: Three times a day (TID) | SUBCUTANEOUS | Status: DC
  Administered 2013-03-17: 8 [IU] via SUBCUTANEOUS
  Administered 2013-03-17: 5 [IU] via SUBCUTANEOUS

## 2013-03-16 MED ORDER — GLIPIZIDE-METFORMIN HCL 5-500 MG PO TABS: 2.0000 | ORAL_TABLET | Freq: Two times a day (BID) | ORAL | Status: DC

## 2013-03-16 MED ORDER — SODIUM CHLORIDE 0.9 % IV SOLN: 250.0000 mL | INTRAVENOUS | Status: DC | PRN

## 2013-03-16 MED ORDER — ZOLPIDEM TARTRATE 5 MG PO TABS: 5.0000 mg | ORAL_TABLET | Freq: Every evening | ORAL | Status: DC | PRN

## 2013-03-16 MED ORDER — ATORVASTATIN CALCIUM 40 MG PO TABS
40.0000 mg | ORAL_TABLET | Freq: Every day | ORAL | Status: DC
  Administered 2013-03-17: 40 mg via ORAL
  Filled 2013-03-16: qty 1

## 2013-03-16 MED ORDER — ACETAMINOPHEN 325 MG PO TABS
650.0000 mg | ORAL_TABLET | ORAL | Status: DC | PRN
  Administered 2013-03-16: 650 mg via ORAL
  Filled 2013-03-16: qty 2

## 2013-03-16 MED ORDER — METOPROLOL TARTRATE 25 MG PO TABS
25.0000 mg | ORAL_TABLET | Freq: Two times a day (BID) | ORAL | Status: DC
  Administered 2013-03-16 – 2013-03-17 (×2): 25 mg via ORAL
  Filled 2013-03-16 (×3): qty 1

## 2013-03-16 NOTE — ED Notes (Signed)
Pt taken to pod c to await admission.  Pt alert/oriented.  Denies chest pain. Report given to [place], rn

## 2013-03-16 NOTE — H&P (Addendum)
History and Physical   Admit date: 03/16/2013 Name:  Debbie [name] Medical record number: [id number] DOB/Age:  01/30/1961  52 y.o. female  Referring Physician:  [facility]  Primary Cardiologist:  Dr.Varanasi  Chief complaint/reason for admission:  Chest pain  HPI:  This 52 year old white female has a history of obesity, hypertension, diabetes and medical noncompliance. She had an acute anterolateral infarction in February 2013 and had a drug-eluting stent placed in the ostium of the LAD. Following this she was noncompliant with her medical regimen and ran out of her medications and was readmitted in December. At that time her stent was patent but showed a 30% in-stent restenosis with minimal disease in the other vessels. She went to [place] for the winter and has returned to [place]. She has been under a lot of situational stress and she stopped taking a number of her medications for financial reasons around one month ago. It's unclear what she has been taking and what she hasn't been taking. She states that she has not been taking her Effient, cholesterol medicines or blood pressure medicines. She began to have chest discomfort described as tightness and pressure usually at night that would be relieved with nitroglycerin. Today she had an episode of discomfort that occurred at rest associated with sweating and took a nitroglycerin and came to the emergency room. Cardiac enzymes were normal and her EKG was nondiagnostic for ischemia. She is admitted at this time with a suspicion of unstable angina pectoris. He stated that she was able to stop smoking for about 3 months but has resumed smoking recently.    Past Medical History  Diagnosis Date  . Diabetes mellitus   . Hypertension   . Depression   . Anxiety   . Asthma   . Coronary artery disease   . Obesity (BMI 30-39.9) 03/16/2013  . 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 Abdominal hysterectomy  2002  . Cervical laminectomy  2007  . Breast surgery      breast reduction 2002  . Coronary stent placement    . Knee surgery    . Left leg fracture    .  Allergies: has No Known Allergies.   Medications: Prior to Admission medications   Medication Sig Start Date End Date Taking? Authorizing Provider  aspirin 81 MG chewable tablet Chew 81 mg by mouth daily.   Yes Historical Provider, MD  glipiZIDE-metformin (METAGLIP) 5-500 MG per tablet Take 2 tablets by mouth 2 (two) times daily before a meal.   Yes Historical Provider, MD  nicotine (NICODERM CQ - DOSED IN MG/24 HR) 7 mg/24hr patch Place 1 patch onto the skin daily.   Yes Historical Provider, MD  nitroGLYCERIN (NITROSTAT) 0.4 MG SL tablet Place 0.4 mg under the tongue every 5 (five) minutes x 2 doses as needed. For chest pain   Yes Historical Provider, MD  zolpidem (AMBIEN CR) 12.5 MG CR tablet Take 12.5 mg by mouth at bedtime as needed for sleep.   Yes Historical Provider, MD   Family History:  Family Status  Relation Status Death Age  . Father Deceased 26's    pneumonia  . Mother Deceased 60's    bled after polyps  . Sister Alive     CAD  . Brother Alive     CHF and kidney disease    Social History:   reports that she has been smoking Cigarettes.  She  has a 30 pack-year smoking history. She does not have any smokeless tobacco history on file. She reports that  drinks alcohol. She reports that she does not use illicit drugs.   History   Social History Narrative   Disability. Formerly worked as Lawyer.  4 children.  Lives wwith son and daughter.     Review of Systems: She has significant situational stress as well significant anxiety and depression and is on disability because of her nerves. She has chronic knee pain. She is been obese for many years. She wears glasses. She has urinary frequency as well as nocturia. She has no edema and has no history of headache or  seizures. Other than as noted above, the remainder of the review of systems is normal  Physical Exam: BP 144/83  Pulse 75  Temp(Src) 98.4 F (36.9 C) (Oral)  Resp 20  SpO2 98%  General appearance: alert, cooperative, appears stated age and Obese black female who is tearful at times and appears anxious but is in no acute distress. Head: Normocephalic, without obvious abnormality, atraumatic Eyes: conjunctivae/corneas clear. PERRL, EOM's intact. Fundi not examined. Neck: no adenopathy, no carotid bruit, no JVD and supple, symmetrical, trachea midline Lungs: clear to auscultation bilaterally Heart: regular rate and rhythm, S1, S2 normal, no murmur, click, rub or gallop Abdomen: soft, non-tender; bowel sounds normal; no masses,  no organomegaly Pelvic: deferred Extremities: extremities normal, atraumatic, no cyanosis or edema Pulses: 2+ and symmetric Neurologic: Grossly normal  Labs: CBC  Recent Labs  03/16/13 1602  WBC 6.2  RBC 5.04  HGB 16.1*  HCT 46.0  PLT 248  MCV 91.3  MCH 31.9  MCHC 35.0  RDW 12.8   CMP   Recent Labs  03/16/13 1602  NA 137  K 3.9  CL 100  CO2 23  GLUCOSE 242*  BUN 13  CREATININE 0.68  CALCIUM 10.1  GFRNONAA >90  GFRAA >90   BNP (last 3 results)  Recent Labs  10/23/12 2032 03/16/13 1602  PROBNP 71.5 97.7   Cardiac Panel (last 3 results) Troponin (Point of Care Test)  Recent Labs  03/16/13 1643  TROPIPOC 0.00    Thyroid  Lab Results  Component Value Date   TSH 3.477 10/23/2012    EKG: Poor R. wave progression, no acute ischemic changes  Radiology: Chest x-ray is clear   IMPRESSIONS: 1. Chest pain consistent with unstable angina pectoris 2. Coronary artery disease with previous stent to the LAD February 2013 that was patent at catheterization 12/13 3. Hypertension 4. Diabetes mellitus with vascular disease poorly controlled 5. Obesity 6. Noncompliance with medical treatment 7. Anxiety and depression 8.  Hyperlipidemia  PLAN: Patient will be brought to telemetry and have serial cardiac enzymes. She just had a catheterization done in December and has been off of her medications. Her medications will be restarted and she will be kept n.p.o. for further testing at the discretion of the attending physician.  Signed: Darden Palmer MD [facility]  03/16/2013, 7:31 PM

## 2013-03-16 NOTE — ED Provider Notes (Deleted)
History     CSN: [id number]  Arrival date & time 03/16/13  1549   First MD Initiated Contact with Patient 03/16/13 1623      Chief Complaint  Patient presents with  . Chest Pain  . Shortness of Breath    (Consider location/radiation/quality/duration/timing/severity/associated sxs/prior treatment) Patient is a 52 y.o. female presenting with chest pain and shortness of breath. The history is provided by the patient.  Chest Pain Associated symptoms: shortness of breath   Associated symptoms: no abdominal pain, no back pain, no headache, no nausea, no numbness, not vomiting and no weakness   Shortness of Breath Associated symptoms: chest pain   Associated symptoms: no abdominal pain, no headaches, no rash and no vomiting   patient presents with shortness of breath and chest pain. She states it feels somewhat like her previous anginal pain. She's had 2 recent heart caths. First was stented the second one had a 30 percent in-stent stenosis. She's been off her medications for around a month now.she states she's had a lot of stress in her life. The pain is dull his been going on worse today but been going for a week. No abdominal pain. She had occasional nausea. No vomiting.  Past Medical History  Diagnosis Date  . Diabetes mellitus   . Hypertension   . Depression   . Anxiety   . Asthma   . Coronary artery disease   . Obesity (BMI 30-39.9) 03/16/2013  . 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 Abdominal hysterectomy  2002  . Cervical laminectomy  2007  . Breast surgery      breast reduction 2002  . Coronary stent placement    . Knee surgery    . Left leg fracture      No family history on file.  History  Substance Use Topics  . Smoking status: Current Every Day Smoker -- 1.00 packs/day for 30 years    Types: Cigarettes  . Smokeless tobacco: Not on file  . Alcohol Use: Yes     Comment: occ    OB  History   Grav Para Term Preterm Abortions TAB SAB Ect Mult Living                  Review of Systems  Constitutional: Negative for activity change and appetite change.  HENT: Negative for neck stiffness.   Eyes: Negative for pain.  Respiratory: Positive for shortness of breath. Negative for chest tightness.   Cardiovascular: Positive for chest pain. Negative for leg swelling.  Gastrointestinal: Negative for nausea, vomiting, abdominal pain and diarrhea.  Genitourinary: Negative for flank pain.  Musculoskeletal: Negative for back pain.  Skin: Negative for rash.  Neurological: Negative for weakness, numbness and headaches.  Psychiatric/Behavioral: Negative for behavioral problems. The patient is nervous/anxious.     Allergies  Review of patient's allergies indicates no known allergies.  Home Medications   No current outpatient prescriptions on file.  BP 172/95  Pulse 71  Temp(Src) 98.9 F (37.2 C) (Oral)  Resp 18  Ht 5\' 7"  (1.702 m)  Wt 204 lb 14.4 oz (92.942 kg)  BMI 32.08 kg/m2  SpO2 99%  Physical Exam  Nursing note and vitals reviewed. Constitutional: She is oriented to person, place, and time. She appears well-developed and well-nourished.  HENT:  Head: Normocephalic and atraumatic.  Eyes: EOM are normal. Pupils are equal, round, and reactive to light.  Neck: Normal range of motion. Neck  supple.  Cardiovascular: Normal rate, regular rhythm and normal heart sounds.   No murmur heard. Pulmonary/Chest: Effort normal and breath sounds normal. No respiratory distress. She has no wheezes. She has no rales.  Abdominal: Soft. Bowel sounds are normal. She exhibits no distension. There is no tenderness. There is no rebound and no guarding.  Musculoskeletal: Normal range of motion.  Neurological: She is alert and oriented to person, place, and time. No cranial nerve deficit.  Skin: Skin is warm and dry.  Psychiatric: She has a normal mood and affect. Her speech is normal.     ED Course  Procedures (including critical care time)  Labs Reviewed  CBC - Abnormal; Notable for the following:    Hemoglobin 16.1 (*)    All other components within normal limits  BASIC METABOLIC PANEL - Abnormal; Notable for the following:    Glucose, Bld 242 (*)    All other components within normal limits  APTT - Abnormal; Notable for the following:    aPTT 143 (*)    All other components within normal limits  COMPREHENSIVE METABOLIC PANEL - Abnormal; Notable for the following:    Glucose, Bld 197 (*)    All other components within normal limits  GLUCOSE, CAPILLARY - Abnormal; Notable for the following:    Glucose-Capillary 193 (*)    All other components within normal limits  PRO B NATRIURETIC PEPTIDE  PROTIME-INR  TROPONIN I  TSH  HEMOGLOBIN A1C  POCT I-STAT TROPONIN I   Dg Chest 2 View  03/16/2013  *RADIOLOGY REPORT*  Clinical Data: Chest pain, shortness of breath  CHEST - 2 VIEW  Comparison: 10/23/2012  Findings: Cardiomediastinal silhouette is stable.  No acute infiltrate or pleural effusion.  No pulmonary edema.  Bony thorax is stable.  IMPRESSION: No active disease.  No significant change.   Original Report Authenticated By: Natasha Mead, M.D.      1. Chest pain   2. Coronary artery disease   3. Hypertension     Date: 03/17/2013  Rate: 81  Rhythm: normal sinus rhythm  QRS Axis: right  Intervals: normal  ST/T Wave abnormalities: normal  Conduction Disutrbances:none  Narrative Interpretation: Q waves anteriorly.  Old EKG Reviewed: unchanged     MDM  Patient with chest pain. Improved with nitroglycerin. Started on nitroglycerin paste here. EKG reassuring enzymes are negative. She'll be admitted to cardiology.        Juliet Rude. Rubin Payor, MD 03/17/13 [phone]

## 2013-03-16 NOTE — ED Notes (Signed)
Pt is here with chest pain, sob, and diaphoresis.  Pt has history of cardiac stent and states nitro at home has been helping,  Pt was recently on long travel to [place].  Pt reports recently with toothache and concerned that it has spread to her heart

## 2013-03-16 NOTE — ED Provider Notes (Signed)
History     CSN: [id number]  Arrival date & time 03/16/13  1549   First MD Initiated Contact with Patient 03/16/13 1623      Chief Complaint  Patient presents with  . Chest Pain  . Shortness of Breath    (Consider location/radiation/quality/duration/timing/severity/associated sxs/prior treatment) Patient is a 52 y.o. female presenting with chest pain and shortness of breath.  Chest Pain Associated symptoms: shortness of breath   Associated symptoms: no abdominal pain, no back pain, no headache, no nausea, no numbness, not vomiting and no weakness   Shortness of Breath Associated symptoms: chest pain   Associated symptoms: no abdominal pain, no headaches, no rash and no vomiting   patient presents with chest pain and shortness of breath over the last week. She states it is worse at night also comes on during the day. It is not worse with exertion it does not limit her exertion. No fevers. No cough. She has a previous diagnosis of coronary artery disease has had a previous stent. She states this feels somewhat like that pain but less severe. She states she's been off her medicines for about a month do to insurance reasons. No weight loss. No weight gain. No diaphoresis. No nausea vomiting or diarrhea.  Past Medical History  Diagnosis Date  . Diabetes mellitus   . Hypertension   . Depression   . Anxiety   . Angina   . Asthma   . Arthritis   . Coronary artery disease     Past Surgical History  Procedure Laterality Date  . Joint replacement  2011    left knee replacement  . Abdominal hysterectomy  2002  . Cervical laminectomy  2007  . Breast surgery      breast reduction 2002  . Coronary stent placement      No family history on file.  History  Substance Use Topics  . Smoking status: Current Every Day Smoker -- 1.00 packs/day    Types: Cigarettes  . Smokeless tobacco: Not on file  . Alcohol Use: Yes     Comment: occ    OB History   Grav Para Term Preterm  Abortions TAB SAB Ect Mult Living                  Review of Systems  Constitutional: Negative for activity change and appetite change.  HENT: Negative for neck stiffness.   Eyes: Negative for pain.  Respiratory: Positive for shortness of breath. Negative for chest tightness.   Cardiovascular: Positive for chest pain. Negative for leg swelling.  Gastrointestinal: Negative for nausea, vomiting, abdominal pain and diarrhea.  Genitourinary: Negative for flank pain.  Musculoskeletal: Negative for back pain.  Skin: Negative for rash.  Neurological: Negative for weakness, numbness and headaches.  Psychiatric/Behavioral: Negative for behavioral problems.    Allergies  Review of patient's allergies indicates no known allergies.  Home Medications   Current Outpatient Rx  Name  Route  Sig  Dispense  Refill  . aspirin 81 MG chewable tablet   Oral   Chew 81 mg by mouth daily.         Marland Kitchen glipiZIDE-metformin (METAGLIP) 5-500 MG per tablet   Oral   Take 2 tablets by mouth 2 (two) times daily before a meal.         . nicotine (NICODERM CQ - DOSED IN MG/24 HR) 7 mg/24hr patch   Transdermal   Place 1 patch onto the skin daily.         Marland Kitchen  nitroGLYCERIN (NITROSTAT) 0.4 MG SL tablet   Sublingual   Place 0.4 mg under the tongue every 5 (five) minutes x 2 doses as needed. For chest pain         . zolpidem (AMBIEN CR) 12.5 MG CR tablet   Oral   Take 12.5 mg by mouth at bedtime as needed for sleep.           BP 144/83  Pulse 75  Temp(Src) 98.4 F (36.9 C) (Oral)  Resp 20  SpO2 98%  Physical Exam  Nursing note and vitals reviewed. Constitutional: She is oriented to person, place, and time. She appears well-developed and well-nourished.  HENT:  Head: Normocephalic and atraumatic.  Eyes: EOM are normal. Pupils are equal, round, and reactive to light.  Neck: Normal range of motion. Neck supple.  Cardiovascular: Normal rate, regular rhythm and normal heart sounds.   No murmur  heard. Pulmonary/Chest: Effort normal and breath sounds normal. No respiratory distress. She has no wheezes. She has no rales.  Abdominal: Soft. Bowel sounds are normal. She exhibits no distension. There is no tenderness. There is no rebound and no guarding.  Musculoskeletal: Normal range of motion.  Neurological: She is alert and oriented to person, place, and time. No cranial nerve deficit.  Skin: Skin is warm and dry.  Psychiatric: She has a normal mood and affect. Her speech is normal.  Patient is somewhat tearful and anxious    ED Course  Procedures (including critical care time)  Labs Reviewed  CBC - Abnormal; Notable for the following:    Hemoglobin 16.1 (*)    All other components within normal limits  BASIC METABOLIC PANEL - Abnormal; Notable for the following:    Glucose, Bld 242 (*)    All other components within normal limits  PRO B NATRIURETIC PEPTIDE  POCT I-STAT TROPONIN I   Dg Chest 2 View  03/16/2013  *RADIOLOGY REPORT*  Clinical Data: Chest pain, shortness of breath  CHEST - 2 VIEW  Comparison: 10/23/2012  Findings: Cardiomediastinal silhouette is stable.  No acute infiltrate or pleural effusion.  No pulmonary edema.  Bony thorax is stable.  IMPRESSION: No active disease.  No significant change.   Original Report Authenticated By: Natasha Mead, M.D.      1. Chest pain   2. Coronary artery disease   3. Hypertension      Date: 03/16/2013  Rate: 81  Rhythm: normal sinus rhythm  QRS Axis: right  Intervals: normal  ST/T Wave abnormalities: nonspecific ST/T changes  Conduction Disutrbances:none  Narrative Interpretation:   Old EKG Reviewed: unchanged    MDM  Patient presents with chest pain is somewhat like her previous angina. She is pain-free now but the pain will wax and wane. She was started on nitroglycerin ointment. She's been noncompliant with her medications. EKG is reassuring. Patient be admitted to cardiology.        Juliet Rude. Rubin Payor,  MD 03/16/13 [phone]

## 2013-03-16 NOTE — Progress Notes (Signed)
ANTICOAGULATION CONSULT NOTE - Initial Consult  Pharmacy Consult for UFH Indication: USAP  No Known Allergies  Patient Measurements: Height: 5' 6.14" (168 cm) Weight: 203 lb 14.8 oz (92.5 kg) IBW/kg (Calculated) : 59.63 Heparin Dosing Weight: 80kg  Vital Signs: Temp: 98.4 F (36.9 C) (04/28 1741) Temp src: Oral (04/28 1741) BP: 144/83 mmHg (04/28 1800) Pulse Rate: 75 (04/28 1800)  Labs:  Recent Labs  03/16/13 1602  HGB 16.1*  HCT 46.0  PLT 248  CREATININE 0.68    Estimated Creatinine Clearance: 94.5 ml/min (by C-G formula based on Cr of 0.68).   Medical History: Past Medical History  Diagnosis Date  . Diabetes mellitus   . Hypertension   . Depression   . Anxiety   . Asthma   . Coronary artery disease   . Obesity (BMI 30-39.9) 03/16/2013  . 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 Troponins negative x1, EKG nonspecific ST changes. Patient known to pharmacy for heparin dosing in December, will follow similar dosing.  Goal of Therapy:  Heparin level 0.3-0.7 units/ml Monitor platelets by anticoagulation protocol: Yes   Plan:  Heparin 4000 unit IV bolus followed by infusion at 1300 units/hr. Check 6 hour heparin level with daily cbc and heparin level.  Verlene Mayer, PharmD, BCPS Pager [phone] 03/16/2013,8:05 PM

## 2013-03-17 ENCOUNTER — Inpatient Hospital Stay (HOSPITAL_COMMUNITY): Payer: Medicare Other

## 2013-03-17 DIAGNOSIS — I200 Unstable angina: Secondary | ICD-10-CM

## 2013-03-17 DIAGNOSIS — E872 Acidosis, unspecified: Secondary | ICD-10-CM

## 2013-03-17 LAB — CBC
HCT: 40.8 % (ref 36.0–46.0)
Hemoglobin: 14.2 g/dL (ref 12.0–15.0)
MCV: 91.3 fL (ref 78.0–100.0)
RBC: 4.47 MIL/uL (ref 3.87–5.11)
RDW: 12.8 % (ref 11.5–15.5)
WBC: 6.5 10*3/uL (ref 4.0–10.5)

## 2013-03-17 LAB — GLUCOSE, CAPILLARY: Glucose-Capillary: 223 mg/dL — ABNORMAL HIGH (ref 70–99)

## 2013-03-17 LAB — LIPID PANEL
Cholesterol: 215 mg/dL — ABNORMAL HIGH (ref 0–200)
Total CHOL/HDL Ratio: 7.4 RATIO

## 2013-03-17 LAB — TROPONIN I
Troponin I: <0.3 ng/mL (ref <0.30)
Troponin I: <0.3 ng/mL (ref <0.30)

## 2013-03-17 LAB — HEPARIN LEVEL (UNFRACTIONATED): Heparin Unfractionated: 0.32 IU/mL (ref 0.30–0.70)

## 2013-03-17 LAB — TSH: TSH: 2.803 u[IU]/mL (ref 0.350–4.500)

## 2013-03-17 MED ORDER — ATORVASTATIN CALCIUM 40 MG PO TABS: 40.0000 mg | ORAL_TABLET | Freq: Every day | ORAL | Status: DC

## 2013-03-17 MED ORDER — REGADENOSON 0.4 MG/5ML IV SOLN
0.4000 mg | Freq: Once | INTRAVENOUS | Status: AC
  Administered 2013-03-17: 0.4 mg via INTRAVENOUS

## 2013-03-17 MED ORDER — CLOPIDOGREL BISULFATE 75 MG PO TABS
75.0000 mg | ORAL_TABLET | Freq: Every day | ORAL | Status: DC
  Administered 2013-03-17: 75 mg via ORAL

## 2013-03-17 MED ORDER — OXYCODONE-ACETAMINOPHEN 5-325 MG PO TABS
1.0000 | ORAL_TABLET | Freq: Four times a day (QID) | ORAL | Status: DC
  Administered 2013-03-17: 1 via ORAL
  Filled 2013-03-17: qty 1

## 2013-03-17 MED ORDER — TECHNETIUM TC 99M SESTAMIBI GENERIC - CARDIOLITE
10.0000 | Freq: Once | INTRAVENOUS | Status: AC | PRN
  Administered 2013-03-17: 10 via INTRAVENOUS

## 2013-03-17 MED ORDER — GLIPIZIDE-METFORMIN HCL 5-500 MG PO TABS: 2.0000 | ORAL_TABLET | Freq: Two times a day (BID) | ORAL | Status: AC

## 2013-03-17 MED ORDER — TECHNETIUM TC 99M SESTAMIBI GENERIC - CARDIOLITE: 30.0000 | Freq: Once | INTRAVENOUS | Status: DC | PRN

## 2013-03-17 MED ORDER — METOPROLOL TARTRATE 25 MG PO TABS: 25.0000 mg | ORAL_TABLET | Freq: Two times a day (BID) | ORAL | Status: AC

## 2013-03-17 MED ORDER — CLOPIDOGREL BISULFATE 75 MG PO TABS: 75.0000 mg | ORAL_TABLET | Freq: Every day | ORAL | Status: DC

## 2013-03-17 NOTE — Progress Notes (Signed)
UR Completed Tena Linebaugh Graves-Bigelow, RN,BSN [phone]  

## 2013-03-17 NOTE — Progress Notes (Signed)
ANTICOAGULATION CONSULT NOTE - Follow Up Consult  Pharmacy Consult for heparin Indication: USAP  Labs:  Recent Labs  03/16/13 1602 03/16/13 2036 03/16/13 2037 03/17/13 0117 03/17/13 0529  HGB 16.1*  --   --   --  14.2  HCT 46.0  --   --   --  40.8  PLT 248  --   --   --  252  APTT  --   --  143*  --   --   LABPROT  --   --  13.5  --   --   INR  --   --  1.04  --   --   HEPARINUNFRC  --   --   --   --  0.32  CREATININE 0.68  --  0.60  --   --   TROPONINI  --  <0.30  --  <0.30  --     Assessment/Plan:  52yo female therapeutic on heparin with initial dosing for USAP.  Will continue gtt at current rate and confirm stable with additional level.  Vernard Gambles, PharmD, BCPS  03/17/2013,6:35 AM

## 2013-03-17 NOTE — Discharge Summary (Signed)
Patient ID: Debbie [name] MRN: [id number] DOB/AGE: November 14, 1961 52 y.o.  Admit date: 03/16/2013 Discharge date: 03/17/2013  Primary Discharge DiagnosisCAD Secondary Discharge DiagnosisDM, depression, prior anterior MI  Significant Diagnostic Studies: nuclear medicine: stresstest showing no ischia, prior anteroapical infarct  Consults: None  Hospital Course: 51 y/o who had an anterior MI in 2013.  SHe had a DES to the ostial LAD.  SHe had a cath 4 months ago showing a patent stent with mild instent restenosis.  She went to [place], supposedly for good, but returned a few weeks ago to [employer].  She has been off all of her medicines.  She has had a lot of stress emotionally due to family issues and felt that she was having chest pain over the past week.  She had a stress test today.  She could not reach target HR due to knee pain.  Lexiscan was used. No ischemia noted. Normal EF. Prior anteroapical MI.    She was set up by case manager to see [facility] primary care.  The importance of taking her meds was stressed.  Effient was switched to clopidogrel for cost savings.   Discharge Exam: Blood pressure 154/84, pulse 65, temperature 98 F (36.7 C), temperature source Tympanic, resp. rate 18, height 5\' 7"  (1.702 m), weight 92.942 kg (204 lb 14.4 oz), SpO2 95.00%.   [place]/AT RRR, S1S2 CTA bilaterally Soft, NT No edema  Labs:   Lab Results  Component Value Date   WBC 6.5 03/17/2013   HGB 14.2 03/17/2013   HCT 40.8 03/17/2013   MCV 91.3 03/17/2013   PLT 252 03/17/2013    Recent Labs Lab 03/16/13 2037  NA 136  K 3.8  CL 99  CO2 21  BUN 12  CREATININE 0.60  CALCIUM 9.4  PROT 7.5  BILITOT 0.3  ALKPHOS 73  ALT 8  AST 15  GLUCOSE 197*   Lab Results  Component Value Date   CKTOTAL 1266* 01/15/2012   CKMB 63.4* 01/15/2012   TROPONINI <0.30 03/17/2013    Lab Results  Component Value Date   CHOL 215* 03/17/2013   CHOL 185 10/24/2012   CHOL 233* 01/16/2012   Lab Results  Component Value Date    HDL 29* 03/17/2013   HDL 35* 10/24/2012   HDL 43 01/16/2012   Lab Results  Component Value Date   LDLCALC UNABLE TO CALCULATE IF TRIGLYCERIDE OVER 400 mg/dL 02/09/4009   LDLCALC 272* 10/24/2012   LDLCALC 115* 01/16/2012   Lab Results  Component Value Date   TRIG 656* 03/17/2013   TRIG 147 10/24/2012   TRIG 373* 01/16/2012   Lab Results  Component Value Date   CHOLHDL 7.4 03/17/2013   CHOLHDL 5.3 10/24/2012   CHOLHDL 5.4 01/16/2012   No results found for this basename: LDLDIRECT     ZDG:UYQIH anterior MI, no ST segment changes, NSR No active disease on chest xray.  FOLLOW UP PLANS AND APPOINTMENTS    Medication List    TAKE these medications       aspirin 81 MG chewable tablet  Chew 81 mg by mouth daily.     atorvastatin 40 MG tablet  Commonly known as:  LIPITOR  Take 1 tablet (40 mg total) by mouth daily at 6 PM.     clopidogrel 75 MG tablet  Commonly known as:  PLAVIX  Take 1 tablet (75 mg total) by mouth daily with breakfast.     glipiZIDE-metformin 5-500 MG per tablet  Commonly known as:  METAGLIP  Take 2 tablets by mouth 2 (two) times daily before a meal.     metoprolol tartrate 25 MG tablet  Commonly known as:  LOPRESSOR  Take 1 tablet (25 mg total) by mouth 2 (two) times daily.     nicotine 7 mg/24hr patch  Commonly known as:  NICODERM CQ - dosed in mg/24 hr  Place 1 patch onto the skin daily.     nitroGLYCERIN 0.4 MG SL tablet  Commonly known as:  NITROSTAT  Place 0.4 mg under the tongue every 5 (five) minutes x 2 doses as needed. For chest pain     zolpidem 12.5 MG CR tablet  Commonly known as:  AMBIEN CR  Take 12.5 mg by mouth at bedtime as needed for sleep.           Follow-up Information   Follow up with [facility]. (await primary care appointment through case manager)    Contact information:   [address] [place] [address] [phone]      Follow up with Corky Crafts., MD. Schedule an appointment as  soon as possible for a visit in 4 weeks.   Contact information:   [address] [place] [address] [phone]       BRING ALL MEDICATIONS WITH YOU TO FOLLOW UP APPOINTMENTS  Time spent with patient to include physician time:25 minutes Signed: Allysia Ingles S. 03/17/2013, 4:57 PM

## 2013-03-17 NOTE — Progress Notes (Signed)
Inpatient Diabetes Program Recommendations  AACE/ADA: New Consensus Statement on Inpatient Glycemic Control (2013)  Target Ranges:  Prepandial:   less than 140 mg/dL      Peak postprandial:   less than 180 mg/dL (1-2 hours)      Critically ill patients:  140 - 180 mg/dL   Results for Debbie [name], Debbie [name] (MRN [id number]) as of 03/17/2013 16:13  Ref. Range 10/23/2012 20:32 03/16/2013 20:37  Hemoglobin A1C Latest Range: <5.7 % 13.4 (H) 10.6 (H)    Some slight improvement in patient's A1c since December.  Patient told me she has not been taking her oral diabetes medications consistently and has been struggling with her depression.  Discussed the fact that the MD may want to place her on insulin for home, yet patient was extremely resistant to this idea and stated she wanted another chance to take her oral medications consistently before starting insulin.  Spoke with RN caring for patient today. RN to call Dr. Eldridge Dace for possible psych consult while inpatient.    Will follow. Ambrose Finland RN, MSN, CDE Diabetes Coordinator Inpatient Diabetes Program [phone]

## 2013-03-17 NOTE — Care Management Note (Unsigned)
    Page 1 of 1   03/17/2013     4:22:20 PM   CARE MANAGEMENT NOTE 03/17/2013  Patient:  Schwoerer,Kelissa L   Account Number:  [id number]  Date Initiated:  03/17/2013  Documentation initiated by:  GRAVES-BIGELOW,Tupac Jeffus  Subjective/Objective Assessment:   Pt admitted with unstable angina. Pt states she does not have a PCP. CM will provide pt with a Health Connect Form for her to call for PCP in the area.     Action/Plan:   CM will also f/u for medication assistance if needed. Pt does not have medicare part D for meds.   Anticipated DC Date:  03/18/2013   Anticipated DC Plan:  HOME/SELF CARE      DC Planning Services  CM consult      Choice offered to / List presented to:             Status of service:  In process, will continue to follow Medicare Important Message given?   (If response is "NO", the following Medicare IM given date fields will be blank) Date Medicare IM given:   Date Additional Medicare IM given:    Discharge Disposition:    Per UR Regulation:  Reviewed for med. necessity/level of care/duration of stay  If discussed at Long Length of Stay Meetings, dates discussed:    Comments:

## 2013-04-28 ENCOUNTER — Emergency Department (HOSPITAL_COMMUNITY)
Admission: EM | Admit: 2013-04-28 | Discharge: 2013-04-28 | Disposition: A | Discharge status: Home / Self Care | Payer: Medicare Other | Attending: Emergency Medicine | Admitting: Emergency Medicine

## 2013-04-28 ENCOUNTER — Encounter (HOSPITAL_COMMUNITY): Payer: Self-pay | Admitting: Emergency Medicine

## 2013-04-28 DIAGNOSIS — F411 Generalized anxiety disorder: Secondary | ICD-10-CM | POA: Insufficient documentation

## 2013-04-28 DIAGNOSIS — I252 Old myocardial infarction: Secondary | ICD-10-CM | POA: Insufficient documentation

## 2013-04-28 DIAGNOSIS — R21 Rash and other nonspecific skin eruption: Secondary | ICD-10-CM | POA: Insufficient documentation

## 2013-04-28 DIAGNOSIS — Z7982 Long term (current) use of aspirin: Secondary | ICD-10-CM | POA: Insufficient documentation

## 2013-04-28 DIAGNOSIS — F3289 Other specified depressive episodes: Secondary | ICD-10-CM | POA: Insufficient documentation

## 2013-04-28 DIAGNOSIS — J45909 Unspecified asthma, uncomplicated: Secondary | ICD-10-CM | POA: Insufficient documentation

## 2013-04-28 DIAGNOSIS — T7840XA Allergy, unspecified, initial encounter: Secondary | ICD-10-CM

## 2013-04-28 DIAGNOSIS — F17200 Nicotine dependence, unspecified, uncomplicated: Secondary | ICD-10-CM | POA: Insufficient documentation

## 2013-04-28 DIAGNOSIS — F329 Major depressive disorder, single episode, unspecified: Secondary | ICD-10-CM | POA: Insufficient documentation

## 2013-04-28 DIAGNOSIS — E119 Type 2 diabetes mellitus without complications: Secondary | ICD-10-CM | POA: Insufficient documentation

## 2013-04-28 DIAGNOSIS — Z9861 Coronary angioplasty status: Secondary | ICD-10-CM | POA: Insufficient documentation

## 2013-04-28 DIAGNOSIS — E669 Obesity, unspecified: Secondary | ICD-10-CM | POA: Insufficient documentation

## 2013-04-28 DIAGNOSIS — Z79899 Other long term (current) drug therapy: Secondary | ICD-10-CM | POA: Insufficient documentation

## 2013-04-28 DIAGNOSIS — I2510 Atherosclerotic heart disease of native coronary artery without angina pectoris: Secondary | ICD-10-CM | POA: Insufficient documentation

## 2013-04-28 DIAGNOSIS — I10 Essential (primary) hypertension: Secondary | ICD-10-CM | POA: Insufficient documentation

## 2013-04-28 MED ORDER — PREDNISONE 20 MG PO TABS: 40.0000 mg | ORAL_TABLET | Freq: Every day | ORAL | Status: DC

## 2013-04-28 MED ORDER — FAMOTIDINE 20 MG PO TABS
40.0000 mg | ORAL_TABLET | Freq: Once | ORAL | Status: AC
  Administered 2013-04-28: 40 mg via ORAL
  Filled 2013-04-28: qty 2

## 2013-04-28 MED ORDER — DIPHENHYDRAMINE HCL 50 MG/ML IJ SOLN
25.0000 mg | Freq: Once | INTRAMUSCULAR | Status: AC
  Administered 2013-04-28: 25 mg via INTRAMUSCULAR
  Filled 2013-04-28: qty 1

## 2013-04-28 MED ORDER — METHYLPREDNISOLONE SODIUM SUCC 125 MG IJ SOLR
125.0000 mg | Freq: Once | INTRAMUSCULAR | Status: AC
  Administered 2013-04-28: 125 mg via INTRAVENOUS
  Filled 2013-04-28: qty 2

## 2013-04-28 MED ORDER — HYDROXYZINE HCL 25 MG PO TABS: 25.0000 mg | ORAL_TABLET | Freq: Four times a day (QID) | ORAL | Status: DC

## 2013-04-28 NOTE — ED Notes (Signed)
Pt presents with multiple red bumps on body. States they are itchy and started 3 days ago. States she took Tramadol 2 days ago and she hadn't took it in a while and doesn't know if this could be from that. Also states that her daughter sleeps in the same bed as her and does not have these bumps.

## 2013-04-28 NOTE — ED Provider Notes (Signed)
Medical screening examination/treatment/procedure(s) were performed by non-physician practitioner and as supervising physician I was immediately available for consultation/collaboration.   Benny Lennert, MD 04/28/13 1105

## 2013-04-28 NOTE — ED Provider Notes (Signed)
History     CSN: [id number]  Arrival date & time 04/28/13  9562   First MD Initiated Contact with Patient 04/28/13 [phone]      Chief Complaint  Patient presents with  . Rash    (Consider location/radiation/quality/duration/timing/severity/associated sxs/prior treatment) HPI Comments: Patient is a 52 year old female who presents with a 3 day history of rash. The rash started gradually and progressively worsened since the onset. The rash is located on bilateral extremities, back, and face. Patient has tried benadryl cream and PO zyrtec with some relief. Patient reports the rash started after taking Tramadol that she was prescribed "many years ago." Patient denies new exposures to soaps, lotions, detergent. Patient reports associated occasional itching. No aggravating/alleviating factors. Patient denies fever, chills, NVD, sore throat, oral lesions, ocular involvement, throat closing, wheezing, SOB, chest pain, abdominal pain. Patient reports no other family members have the same rash.     Patient is a 52 y.o. female presenting with rash.  Rash   Past Medical History  Diagnosis Date  . Diabetes mellitus   . Hypertension   . Depression   . Anxiety   . Asthma   . Coronary artery disease   . Obesity (BMI 30-39.9) 03/16/2013  . 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 Abdominal hysterectomy  2002  . Cervical laminectomy  2007  . Breast surgery      breast reduction 2002  . Coronary stent placement    . Knee surgery    . Left leg fracture      No family history on file.  History  Substance Use Topics  . Smoking status: Current Every Day Smoker -- 1.00 packs/day for 30 years    Types: Cigarettes  . Smokeless tobacco: Not on file  . Alcohol Use: Yes     Comment: occ    OB History   Grav Para Term Preterm Abortions TAB SAB Ect Mult Living                  Review of Systems  Skin: Positive for rash.   All other systems reviewed and are negative.    Allergies  Review of patient's allergies indicates no known allergies.  Home Medications   Current Outpatient Rx  Name  Route  Sig  Dispense  Refill  . aspirin 81 MG chewable tablet   Oral   Chew 81 mg by mouth daily.         Marland Kitchen atorvastatin (LIPITOR) 40 MG tablet   Oral   Take 1 tablet (40 mg total) by mouth daily at 6 PM.   30 tablet   11   . clopidogrel (PLAVIX) 75 MG tablet   Oral   Take 1 tablet (75 mg total) by mouth daily with breakfast.   30 tablet   11   . glipiZIDE-metformin (METAGLIP) 5-500 MG per tablet   Oral   Take 2 tablets by mouth 2 (two) times daily before a meal.   60 tablet   1   . metoprolol tartrate (LOPRESSOR) 25 MG tablet   Oral   Take 1 tablet (25 mg total) by mouth 2 (two) times daily.   60 tablet   11   . nicotine (NICODERM CQ - DOSED IN MG/24 HR) 7 mg/24hr patch   Transdermal   Place 1 patch onto the skin daily.         . nitroGLYCERIN (NITROSTAT) 0.4 MG SL tablet  Sublingual   Place 0.4 mg under the tongue every 5 (five) minutes x 2 doses as needed. For chest pain         . zolpidem (AMBIEN CR) 12.5 MG CR tablet   Oral   Take 12.5 mg by mouth at bedtime as needed for sleep.           BP 186/86  Pulse 96  Temp(Src) 97.5 F (36.4 C) (Oral)  Resp 17  SpO2 100%  Physical Exam  Nursing note and vitals reviewed. Constitutional: She appears well-developed and well-nourished. No distress.  HENT:  Head: Normocephalic and atraumatic.  Mouth/Throat: Oropharynx is clear and moist. No oropharyngeal exudate.  Eyes: Conjunctivae are normal.  Neck: Normal range of motion.  Cardiovascular: Normal rate and regular rhythm.  Exam reveals no gallop and no friction rub.   No murmur heard. Pulmonary/Chest: Effort normal and breath sounds normal. She has no wheezes. She has no rales. She exhibits no tenderness.  Abdominal: Soft. There is no tenderness.  Musculoskeletal: Normal range  of motion.  Neurological: She is alert. Coordination normal.  Speech is goal-oriented. Moves limbs without ataxia.   Skin: Skin is warm and dry.  Scattered urticarial lesions of extremities, back and face. Pruritic. No open wound noted.   Psychiatric: She has a normal mood and affect. Her behavior is normal.    ED Course  Procedures (including critical care time)  Labs Reviewed - No data to display No results found.   1. Allergic reaction, initial encounter       MDM  8:02 AM Patient will have pepcid, benadryl and solumedrol. Patient is not wheezing and denies SOB. Vitals stable and patient afebrile.   Marland Kitchen8:53 AM Patient reports relief of her itching and subjective improvement. Patient will be discharged with prednisone and hydroxyzine for itching. Patient instructed to return with worsening or concerning symptoms.      Emilia Beck, [place] 04/28/13 [phone]

## 2014-05-03 ENCOUNTER — Encounter (HOSPITAL_COMMUNITY): Payer: Self-pay | Admitting: Emergency Medicine

## 2014-05-03 ENCOUNTER — Emergency Department (HOSPITAL_COMMUNITY)
Admission: EM | Admit: 2014-05-03 | Discharge: 2014-05-04 | Disposition: A | Discharge status: Home / Self Care | Payer: Medicare HMO | Attending: Emergency Medicine | Admitting: Emergency Medicine

## 2014-05-03 DIAGNOSIS — I2510 Atherosclerotic heart disease of native coronary artery without angina pectoris: Secondary | ICD-10-CM | POA: Insufficient documentation

## 2014-05-03 DIAGNOSIS — F3289 Other specified depressive episodes: Secondary | ICD-10-CM | POA: Insufficient documentation

## 2014-05-03 DIAGNOSIS — E669 Obesity, unspecified: Secondary | ICD-10-CM | POA: Insufficient documentation

## 2014-05-03 DIAGNOSIS — Z9861 Coronary angioplasty status: Secondary | ICD-10-CM | POA: Insufficient documentation

## 2014-05-03 DIAGNOSIS — I252 Old myocardial infarction: Secondary | ICD-10-CM | POA: Insufficient documentation

## 2014-05-03 DIAGNOSIS — L02214 Cutaneous abscess of groin: Secondary | ICD-10-CM

## 2014-05-03 DIAGNOSIS — R739 Hyperglycemia, unspecified: Secondary | ICD-10-CM

## 2014-05-03 DIAGNOSIS — F17200 Nicotine dependence, unspecified, uncomplicated: Secondary | ICD-10-CM | POA: Insufficient documentation

## 2014-05-03 DIAGNOSIS — Z7982 Long term (current) use of aspirin: Secondary | ICD-10-CM | POA: Insufficient documentation

## 2014-05-03 DIAGNOSIS — I10 Essential (primary) hypertension: Secondary | ICD-10-CM | POA: Insufficient documentation

## 2014-05-03 DIAGNOSIS — F329 Major depressive disorder, single episode, unspecified: Secondary | ICD-10-CM | POA: Insufficient documentation

## 2014-05-03 DIAGNOSIS — Z79899 Other long term (current) drug therapy: Secondary | ICD-10-CM | POA: Insufficient documentation

## 2014-05-03 DIAGNOSIS — L03319 Cellulitis of trunk, unspecified: Principal | ICD-10-CM

## 2014-05-03 DIAGNOSIS — B372 Candidiasis of skin and nail: Secondary | ICD-10-CM | POA: Insufficient documentation

## 2014-05-03 DIAGNOSIS — E119 Type 2 diabetes mellitus without complications: Secondary | ICD-10-CM | POA: Insufficient documentation

## 2014-05-03 DIAGNOSIS — J45909 Unspecified asthma, uncomplicated: Secondary | ICD-10-CM | POA: Insufficient documentation

## 2014-05-03 DIAGNOSIS — L02219 Cutaneous abscess of trunk, unspecified: Secondary | ICD-10-CM | POA: Insufficient documentation

## 2014-05-03 DIAGNOSIS — F411 Generalized anxiety disorder: Secondary | ICD-10-CM | POA: Insufficient documentation

## 2014-05-03 NOTE — ED Notes (Signed)
Pt in c/o multiple abscesses to groin area since the last month, states area is spreading and has been draining

## 2014-05-04 LAB — CBG MONITORING, ED: GLUCOSE-CAPILLARY: 285 mg/dL — AB (ref 70–99)

## 2014-05-04 MED ORDER — CLINDAMYCIN HCL 300 MG PO CAPS
300.0000 mg | ORAL_CAPSULE | Freq: Once | ORAL | Status: AC
  Administered 2014-05-04: 300 mg via ORAL
  Filled 2014-05-04: qty 1

## 2014-05-04 MED ORDER — NYSTATIN 100000 UNIT/GM EX POWD: CUTANEOUS | Status: AC

## 2014-05-04 MED ORDER — CLINDAMYCIN HCL 300 MG PO CAPS: 300.0000 mg | ORAL_CAPSULE | Freq: Four times a day (QID) | ORAL | Status: AC

## 2014-05-04 NOTE — ED Notes (Signed)
Patient declined wheelchair. RN escorted patient to exit 

## 2014-05-04 NOTE — Discharge Instructions (Signed)
Abscess °An abscess is an infected area that contains a collection of pus and debris. It can occur in almost any part of the body. An abscess is also known as a furuncle or boil. °CAUSES  °An abscess occurs when tissue gets infected. This can occur from blockage of oil or sweat glands, infection of hair follicles, or a minor injury to the skin. As the body tries to fight the infection, pus collects in the area and creates pressure under the skin. This pressure causes pain. People with weakened immune systems have difficulty fighting infections and get certain abscesses more often.  °SYMPTOMS °Usually an abscess develops on the skin and becomes a painful mass that is red, warm, and tender. If the abscess forms under the skin, you may feel a moveable soft area under the skin. Some abscesses break open (rupture) on their own, but most will continue to get worse without care. The infection can spread deeper into the body and eventually into the bloodstream, causing you to feel ill.  °DIAGNOSIS  °Your caregiver will take your medical history and perform a physical exam. A sample of fluid may also be taken from the abscess to determine what is causing your infection. °TREATMENT  °Your caregiver may prescribe antibiotic medicines to fight the infection. However, taking antibiotics alone usually does not cure an abscess. Your caregiver may need to make a small cut (incision) in the abscess to drain the pus. In some cases, gauze is packed into the abscess to reduce pain and to continue draining the area. °HOME CARE INSTRUCTIONS  °· Only take over-the-counter or prescription medicines for pain, discomfort, or fever as directed by your caregiver. °· If you were prescribed antibiotics, take them as directed. Finish them even if you start to feel better. °· If gauze is used, follow your caregiver's directions for changing the gauze. °· To avoid spreading the infection: °· Keep your draining abscess covered with a  bandage. °· Wash your hands well. °· Do not share personal care items, towels, or whirlpools with others. °· Avoid skin contact with others. °· Keep your skin and clothes clean around the abscess. °· Keep all follow-up appointments as directed by your caregiver. °SEEK MEDICAL CARE IF:  °· You have increased pain, swelling, redness, fluid drainage, or bleeding. °· You have muscle aches, chills, or a general ill feeling. °· You have a fever. °MAKE SURE YOU:  °· Understand these instructions. °· Will watch your condition. °· Will get help right away if you are not doing well or get worse. °Document Released: 08/15/2005 Document Revised: 05/06/2012 Document Reviewed: 01/18/2012 °ExitCare® Patient Information ©2014 [employer]. ° °Candida Infection, Adult °A candida infection (also called yeast, fungus and Monilia infection) is an overgrowth of yeast that can occur anywhere on the body. A yeast infection commonly occurs in warm, moist body areas. Usually, the infection remains localized but can spread to become a systemic infection. A yeast infection may be a sign of a more severe disease such as diabetes, leukemia, or AIDS. °A yeast infection can occur in both men and women. In women, Candida vaginitis is a vaginal infection. It is one of the most common causes of vaginitis. Men usually do not have symptoms or know they have an infection until other problems develop. Men may find out they have a yeast infection because their sex partner has a yeast infection. Uncircumcised men are more likely to get a yeast infection than circumcised men. This is because the uncircumcised glans is not   exposed to air and does not remain as dry as that of a circumcised glans. Older adults may develop yeast infections around dentures. °CAUSES  °Women °· Antibiotics. °· Steroid medication taken for a long time. °· Being overweight (obese). °· Diabetes. °· Poor immune condition. °· Certain serious medical conditions. °· Immune suppressive  medications for organ transplant patients. °· Chemotherapy. °· Pregnancy. °· Menstration. °· Stress and fatigue. °· Intravenous drug use. °· Oral contraceptives. °· Wearing tight-fitting clothes in the crotch area. °· Catching it from a sex partner who has a yeast infection. °· Spermicide. °· Intravenous, urinary, or other catheters. °Men °· Catching it from a sex partner who has a yeast infection. °· Having oral or anal sex with a person who has the infection. °· Spermicide. °· Diabetes. °· Antibiotics. °· Poor immune system. °· Medications that suppress the immune system. °· Intravenous drug use. °· Intravenous, urinary, or other catheters. °SYMPTOMS  °Women °· Thick, white vaginal discharge. °· Vaginal itching. °· Redness and swelling in and around the vagina. °· Irritation of the lips of the vagina and perineum. °· Blisters on the vaginal lips and perineum. °· Painful sexual intercourse. °· Low blood sugar (hypoglycemia). °· Painful urination. °· Bladder infections. °· Intestinal problems such as constipation, indigestion, bad breath, bloating, increase in gas, diarrhea, or loose stools. °Men °· Men may develop intestinal problems such as constipation, indigestion, bad breath, bloating, increase in gas, diarrhea, or loose stools. °· Dry, cracked skin on the penis with itching or discomfort. °· Jock itch. °· Dry, flaky skin. °· Athlete's foot. °· Hypoglycemia. °DIAGNOSIS  °Women °· A history and an exam are performed. °· The discharge may be examined under a microscope. °· A culture may be taken of the discharge. °Men °· A history and an exam are performed. °· Any discharge from the penis or areas of cracked skin will be looked at under the microscope and cultured. °· Stool samples may be cultured. °TREATMENT  °Women °· Vaginal antifungal suppositories and creams. °· Medicated creams to decrease irritation and itching on the outside of the vagina. °· Warm compresses to the perineal area to decrease swelling and  discomfort. °· Oral antifungal medications. °· Medicated vaginal suppositories or cream for repeated or recurrent infections. °· Wash and dry the irritation areas before applying the cream. °· Eating yogurt with lactobacillus may help with prevention and treatment. °· Sometimes painting the vagina with gentian violet solution may help if creams and suppositories do not work. °Men °· Antifungal creams and oral antifungal medications. °· Sometimes treatment must continue for 30 days after the symptoms go away to prevent recurrence. °HOME CARE INSTRUCTIONS  °Women °· Use cotton underwear and avoid tight-fitting clothing. °· Avoid colored, scented toilet paper and deodorant tampons or pads. °· Do not douche. °· Keep your diabetes under control. °· Finish all the prescribed medications. °· Keep your skin clean and dry. °· Consume milk or yogurt with lactobacillus active culture regularly. If you get frequent yeast infections and think that is what the infection is, there are over-the-counter medications that you can get. If the infection does not show healing in 3 days, talk to your caregiver. °· Tell your sex partner you have a yeast infection. Your partner may need treatment also, especially if your infection does not clear up or recurs. °Men °· Keep your skin clean and dry. °· Keep your diabetes under control. °· Finish all prescribed medications. °· Tell your sex partner that you have a yeast infection   so they can be treated if necessary. °SEEK MEDICAL CARE IF:  °· Your symptoms do not clear up or worsen in one week after treatment. °· You have an oral temperature above 102° F (38.9° C). °· You have trouble swallowing or eating for a prolonged time. °· You develop blisters on and around your vagina. °· You develop vaginal bleeding and it is not your menstrual period. °· You develop abdominal pain. °· You develop intestinal problems as mentioned above. °· You get weak or lightheaded. °· You have painful or increased  urination. °· You have pain during sexual intercourse. °MAKE SURE YOU:  °· Understand these instructions. °· Will watch your condition. °· Will get help right away if you are not doing well or get worse. °Document Released: 12/13/2004 Document Revised: 01/28/2012 Document Reviewed: 03/27/2010 °ExitCare® Patient Information ©2014 [employer]. ° °

## 2014-05-04 NOTE — ED Provider Notes (Signed)
incision and drainage  1%lidocaine 27 gage needle  11 blade Small amount of drainage for abscess Left open  Arman FilterGail K Chevelle Coulson, NP 05/04/14 [id number]

## 2014-05-04 NOTE — ED Provider Notes (Signed)
CSN: [id number]     Arrival date & time 05/03/14  1820 History   First MD Initiated Contact with Patient 05/03/14 2304     Chief Complaint  Patient presents with  . Abscess     (Consider location/radiation/quality/duration/timing/severity/associated sxs/prior Treatment) HPI Patient states she's had one month of recurrent abscesses to the groin area. She states that spontaneously drained. She tried multiple different over-the-counter creams and powders with little improvement. She denies any fevers or chills. She has a history of previous abscesses. Past Medical History  Diagnosis Date  . Diabetes mellitus   . Hypertension   . Depression   . Anxiety   . Asthma   . Coronary artery disease   . Obesity (BMI 30-39.9) 03/16/2013  . 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 Abdominal hysterectomy  2002  . Cervical laminectomy  2007  . Breast surgery      breast reduction 2002  . Coronary stent placement    . Knee surgery    . Left leg fracture     History reviewed. No pertinent family history. History  Substance Use Topics  . Smoking status: Current Every Day Smoker -- 1.00 packs/day for 30 years    Types: Cigarettes  . Smokeless tobacco: Not on file  . Alcohol Use: Yes     Comment: occ   OB History   Grav Para Term Preterm Abortions TAB SAB Ect Mult Living                 Review of Systems  Constitutional: Negative for fever and chills.  Gastrointestinal: Negative for nausea and vomiting.  Skin: Positive for rash.  All other systems reviewed and are negative.     Allergies  Review of patient's allergies indicates no known allergies.  Home Medications   Prior to Admission medications   Medication Sig Start Date End Date Taking? Authorizing Provider  aspirin 81 MG chewable tablet Chew 81 mg by mouth daily.   Yes Historical Provider, MD  glipiZIDE-metformin (METAGLIP) 5-500 MG per tablet Take 2  tablets by mouth 2 (two) times daily before a meal. 03/17/13  Yes Corky CraftsJayadeep S Varanasi, MD  Multiple Vitamins-Minerals (MULTIVITAMIN WITH MINERALS) tablet Take 1 tablet by mouth daily.   Yes Historical Provider, MD  sertraline (ZOLOFT) 50 MG tablet Take 50 mg by mouth daily.   Yes Historical Provider, MD  metoprolol tartrate (LOPRESSOR) 25 MG tablet Take 1 tablet (25 mg total) by mouth 2 (two) times daily. 03/17/13   Corky CraftsJayadeep S Varanasi, MD  nitroGLYCERIN (NITROSTAT) 0.4 MG SL tablet Place 0.4 mg under the tongue every 5 (five) minutes x 2 doses as needed. For chest pain    Historical Provider, MD   BP 148/63  Pulse 80  Temp(Src) 98.2 F (36.8 C) (Oral)  Resp 20  SpO2 96% Physical Exam  Nursing note and vitals reviewed. Constitutional: She is oriented to person, place, and time. She appears well-developed and well-nourished. No distress.  HENT:  Head: Normocephalic and atraumatic.  Mouth/Throat: Oropharynx is clear and moist.  Eyes: EOM are normal. Pupils are equal, round, and reactive to light.  Neck: Normal range of motion. Neck supple.  Cardiovascular: Normal rate and regular rhythm.   Pulmonary/Chest: Effort normal and breath sounds normal. No respiratory distress. She has no wheezes. She has no rales.  Abdominal: Soft. Bowel sounds are normal. She exhibits no distension and no mass. There is no tenderness. There is no  rebound and no guarding.  Genitourinary:  Abscess roughly 2 cm in diameter adjacent to the right inguinal. Appears to be spontaneously draining. Mild surrounding erythema.  Intertriginous rash with satellite lesions consistent with candidal infection noted in the groin region  Musculoskeletal: Normal range of motion. She exhibits no edema and no tenderness.  Neurological: She is alert and oriented to person, place, and time.  Skin: Skin is warm and dry. Rash noted. There is erythema.  Psychiatric: She has a normal mood and affect. Her behavior is normal.    ED Course   Procedures (including critical care time) Labs Review Labs Reviewed - No data to display  Imaging Review No results found.   EKG Interpretation None      MDM   Final diagnoses:  None   Abscess drained by mid level provider. Patient started on antibiotics. Return precautions given.     Loren Raceravid Yelverton, MD 05/05/14 [phone]

## 2014-05-05 NOTE — ED Provider Notes (Signed)
Medical screening examination/treatment/procedure(s) were performed by non-physician practitioner and as supervising physician I was immediately available for consultation/collaboration.   EKG Interpretation None        David Yelverton, MD 05/05/14 0337 

## 2014-10-28 ENCOUNTER — Encounter (HOSPITAL_COMMUNITY): Payer: Self-pay | Admitting: Interventional Cardiology

## 2014-11-25 ENCOUNTER — Emergency Department (HOSPITAL_COMMUNITY): Payer: Medicare HMO

## 2014-11-25 ENCOUNTER — Encounter (HOSPITAL_COMMUNITY): Payer: Self-pay | Admitting: Emergency Medicine

## 2014-11-25 ENCOUNTER — Emergency Department (HOSPITAL_COMMUNITY)
Admission: EM | Admit: 2014-11-25 | Discharge: 2014-11-25 | Disposition: A | Discharge status: Home / Self Care | Payer: Medicare HMO | Attending: Emergency Medicine | Admitting: Emergency Medicine

## 2014-11-25 DIAGNOSIS — M549 Dorsalgia, unspecified: Secondary | ICD-10-CM | POA: Insufficient documentation

## 2014-11-25 DIAGNOSIS — Z90710 Acquired absence of both cervix and uterus: Secondary | ICD-10-CM | POA: Diagnosis not present

## 2014-11-25 DIAGNOSIS — Z79899 Other long term (current) drug therapy: Secondary | ICD-10-CM | POA: Diagnosis not present

## 2014-11-25 DIAGNOSIS — J45909 Unspecified asthma, uncomplicated: Secondary | ICD-10-CM | POA: Insufficient documentation

## 2014-11-25 DIAGNOSIS — F419 Anxiety disorder, unspecified: Secondary | ICD-10-CM | POA: Insufficient documentation

## 2014-11-25 DIAGNOSIS — Z9861 Coronary angioplasty status: Secondary | ICD-10-CM | POA: Insufficient documentation

## 2014-11-25 DIAGNOSIS — F329 Major depressive disorder, single episode, unspecified: Secondary | ICD-10-CM | POA: Diagnosis not present

## 2014-11-25 DIAGNOSIS — I252 Old myocardial infarction: Secondary | ICD-10-CM | POA: Diagnosis not present

## 2014-11-25 DIAGNOSIS — Z720 Tobacco use: Secondary | ICD-10-CM | POA: Diagnosis not present

## 2014-11-25 DIAGNOSIS — Z9889 Other specified postprocedural states: Secondary | ICD-10-CM | POA: Insufficient documentation

## 2014-11-25 DIAGNOSIS — R1011 Right upper quadrant pain: Secondary | ICD-10-CM

## 2014-11-25 DIAGNOSIS — E669 Obesity, unspecified: Secondary | ICD-10-CM | POA: Diagnosis not present

## 2014-11-25 DIAGNOSIS — Z7982 Long term (current) use of aspirin: Secondary | ICD-10-CM | POA: Insufficient documentation

## 2014-11-25 DIAGNOSIS — I10 Essential (primary) hypertension: Secondary | ICD-10-CM | POA: Insufficient documentation

## 2014-11-25 DIAGNOSIS — E1165 Type 2 diabetes mellitus with hyperglycemia: Secondary | ICD-10-CM | POA: Insufficient documentation

## 2014-11-25 DIAGNOSIS — R739 Hyperglycemia, unspecified: Secondary | ICD-10-CM

## 2014-11-25 LAB — CBC WITH DIFFERENTIAL/PLATELET
BASOS ABS: 0.1 10*3/uL (ref 0.0–0.1)
BASOS PCT: 1 % (ref 0–1)
Eosinophils Absolute: 0.1 10*3/uL (ref 0.0–0.7)
Eosinophils Relative: 1 % (ref 0–5)
HCT: 45.1 % (ref 36.0–46.0)
Hemoglobin: 15.1 g/dL — ABNORMAL HIGH (ref 12.0–15.0)
LYMPHS ABS: 2.1 10*3/uL (ref 0.7–4.0)
Lymphocytes Relative: 36 % (ref 12–46)
MCH: 31.3 pg (ref 26.0–34.0)
MCHC: 33.5 g/dL (ref 30.0–36.0)
MCV: 93.4 fL (ref 78.0–100.0)
Monocytes Absolute: 0.3 10*3/uL (ref 0.1–1.0)
Monocytes Relative: 5 % (ref 3–12)
NEUTROS PCT: 57 % (ref 43–77)
Neutro Abs: 3.4 10*3/uL (ref 1.7–7.7)
PLATELETS: 251 10*3/uL (ref 150–400)
RBC: 4.83 MIL/uL (ref 3.87–5.11)
RDW: 12.6 % (ref 11.5–15.5)
WBC: 5.9 10*3/uL (ref 4.0–10.5)

## 2014-11-25 LAB — URINALYSIS, ROUTINE W REFLEX MICROSCOPIC
BILIRUBIN URINE: NEGATIVE
Glucose, UA: >1000 mg/dL — AB
Hgb urine dipstick: NEGATIVE
KETONES UR: 15 mg/dL — AB
Leukocytes, UA: NEGATIVE
NITRITE: NEGATIVE
PROTEIN: NEGATIVE mg/dL
Specific Gravity, Urine: 1.045 — ABNORMAL HIGH (ref 1.005–1.030)
UROBILINOGEN UA: 0.2 mg/dL (ref 0.0–1.0)
pH: 5 (ref 5.0–8.0)

## 2014-11-25 LAB — CBG MONITORING, ED
GLUCOSE-CAPILLARY: 301 mg/dL — AB (ref 70–99)
GLUCOSE-CAPILLARY: 139 mg/dL — AB (ref 70–99)
Glucose-Capillary: 351 mg/dL — ABNORMAL HIGH (ref 70–99)

## 2014-11-25 LAB — COMPREHENSIVE METABOLIC PANEL
ALBUMIN: 4.1 g/dL (ref 3.5–5.2)
ALK PHOS: 97 U/L (ref 39–117)
ALT: 16 U/L (ref 0–35)
AST: 22 U/L (ref 0–37)
Anion gap: 13 (ref 5–15)
BUN: 15 mg/dL (ref 6–23)
CHLORIDE: 100 meq/L (ref 96–112)
CO2: 22 mmol/L (ref 19–32)
Calcium: 9.5 mg/dL (ref 8.4–10.5)
Creatinine, Ser: 1.01 mg/dL (ref 0.50–1.10)
GFR calc Af Amer: 72 mL/min — ABNORMAL LOW (ref >90)
GFR calc non Af Amer: 62 mL/min — ABNORMAL LOW (ref >90)
Glucose, Bld: 494 mg/dL — ABNORMAL HIGH (ref 70–99)
POTASSIUM: 4.4 mmol/L (ref 3.5–5.1)
SODIUM: 135 mmol/L (ref 135–145)
TOTAL PROTEIN: 7.4 g/dL (ref 6.0–8.3)
Total Bilirubin: 0.5 mg/dL (ref 0.3–1.2)

## 2014-11-25 LAB — URINE MICROSCOPIC-ADD ON

## 2014-11-25 LAB — I-STAT TROPONIN, ED: TROPONIN I, POC: 0 ng/mL (ref 0.00–0.08)

## 2014-11-25 LAB — LIPASE, BLOOD: LIPASE: 32 U/L (ref 11–59)

## 2014-11-25 MED ORDER — IOHEXOL 300 MG/ML  SOLN
100.0000 mL | Freq: Once | INTRAMUSCULAR | Status: AC | PRN
  Administered 2014-11-25: 100 mL via INTRAVENOUS

## 2014-11-25 MED ORDER — ONDANSETRON HCL 4 MG/2ML IJ SOLN
4.0000 mg | Freq: Once | INTRAMUSCULAR | Status: AC
  Administered 2014-11-25: 4 mg via INTRAVENOUS
  Filled 2014-11-25: qty 2

## 2014-11-25 MED ORDER — MORPHINE SULFATE 4 MG/ML IJ SOLN
6.0000 mg | Freq: Once | INTRAMUSCULAR | Status: AC
  Administered 2014-11-25: 6 mg via INTRAVENOUS
  Filled 2014-11-25: qty 2

## 2014-11-25 MED ORDER — INSULIN ASPART 100 UNIT/ML ~~LOC~~ SOLN
10.0000 [IU] | Freq: Once | SUBCUTANEOUS | Status: AC
  Administered 2014-11-25: 10 [IU] via SUBCUTANEOUS
  Filled 2014-11-25: qty 1

## 2014-11-25 MED ORDER — SODIUM CHLORIDE 0.9 % IV BOLUS (SEPSIS)
1000.0000 mL | Freq: Once | INTRAVENOUS | Status: AC
  Administered 2014-11-25: 1000 mL via INTRAVENOUS

## 2014-11-25 MED ORDER — IOHEXOL 300 MG/ML  SOLN
25.0000 mL | INTRAMUSCULAR | Status: AC
  Administered 2014-11-25: 25 mL via ORAL

## 2014-11-25 NOTE — ED Provider Notes (Signed)
CSN: [id number]     Arrival date & time 11/25/14  1325 History   First MD Initiated Contact with Patient 11/25/14 1525     Chief Complaint  Patient presents with  . Abdominal Pain  . Back Pain     (Consider location/radiation/quality/duration/timing/severity/associated sxs/prior Treatment) HPI   54 year old female with history of non-insulin-dependent diabetes, hypertension, depression, CAD with prior MI who presents for evaluation of abdominal pain. Patient reports constant dull ache with intermittent sharp pain to her right upper quadrant which radiates up to her right shoulder and arm. Pain is usually worsen at nighttime but not associated with eating or with movement. This has been ongoing for the past several months. Today she felt nauseous with worsening pain. No associated fever, chills, chest pain, shortness of breath, productive cough, lightheadedness, dizziness, back pain, dysuria, hematuria, hematochezia or melena. She still has an intact gallbladder and appendix. She has had prior hysterectomy. She does eat foods high in fat content. No specific treatment tried.  Past Medical History  Diagnosis Date  . Diabetes mellitus   . Hypertension   . Depression   . Anxiety   . Asthma   . Coronary artery disease   . Obesity (BMI 30-39.9) 03/16/2013  . 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 Abdominal hysterectomy  2002  . Cervical laminectomy  2007  . Breast surgery      breast reduction 2002  . Coronary stent placement    . Knee surgery    . Left leg fracture    . Left heart catheterization with coronary angiogram N/A 01/15/2012    Procedure: LEFT HEART CATHETERIZATION WITH CORONARY ANGIOGRAM;  Surgeon: Corky Crafts, MD;  Location[facility] CATH LAB;  Service: Cardiovascular;  Laterality: N/A;  . Percutaneous coronary stent intervention (pci-s) N/A 01/15/2012    Procedure: PERCUTANEOUS CORONARY STENT INTERVENTION  (PCI-S);  Surgeon: Corky Crafts, MD;  Location: [facility] CATH LAB;  Service: Cardiovascular;  Laterality: N/A;  . Left heart catheterization with coronary angiogram N/A 10/24/2012    Procedure: LEFT HEART CATHETERIZATION WITH CORONARY ANGIOGRAM;  Surgeon: Corky Crafts, MD;  Location: [facility] CATH LAB;  Service: Cardiovascular;  Laterality: N/A;   History reviewed. No pertinent family history. History  Substance Use Topics  . Smoking status: Current Every Day Smoker -- 1.00 packs/day for 30 years    Types: Cigarettes  . Smokeless tobacco: Not on file  . Alcohol Use: Yes     Comment: occ   OB History    No data available     Review of Systems  All other systems reviewed and are negative.     Allergies  Review of patient's allergies indicates no known allergies.  Home Medications   Prior to Admission medications   Medication Sig Start Date End Date Taking? Authorizing Provider  aspirin 81 MG chewable tablet Chew 81 mg by mouth daily.    Historical Provider, MD  clindamycin (CLEOCIN) 300 MG capsule Take 1 capsule (300 mg total) by mouth 4 (four) times daily. X 7 days 05/04/14   Loren Racer, MD  glipiZIDE-metformin (METAGLIP) 5-500 MG per tablet Take 2 tablets by mouth 2 (two) times daily before a meal. 03/17/13   Corky Crafts, MD  metoprolol tartrate (LOPRESSOR) 25 MG tablet Take 1 tablet (25 mg total) by mouth 2 (two) times daily. 03/17/13   Corky Crafts, MD  Multiple Vitamins-Minerals (MULTIVITAMIN WITH MINERALS) tablet Take 1 tablet by mouth  daily.    Historical Provider, MD  nitroGLYCERIN (NITROSTAT) 0.4 MG SL tablet Place 0.4 mg under the tongue every 5 (five) minutes x 2 doses as needed. For chest pain    Historical Provider, MD  nystatin (MYCOSTATIN/NYSTOP) 100000 UNIT/GM POWD Apply twice a day to the groin area. 05/04/14   Loren Racer, MD  sertraline (ZOLOFT) 50 MG tablet Take 50 mg by mouth daily.    Historical Provider, MD   BP 180/80 mmHg  Pulse 91   Temp(Src) 98.1 F (36.7 C) (Oral)  Resp 18  Ht 5\' 6"  (1.676 m)  SpO2 97% Physical Exam  Constitutional: She appears well-developed and well-nourished. No distress.  Patient is tearful.  HENT:  Head: Atraumatic.  Eyes: Conjunctivae are normal.  Neck: Neck supple.  Cardiovascular: Normal rate and regular rhythm.   Pulmonary/Chest: Effort normal and breath sounds normal. No respiratory distress. She has no wheezes. She has no rales. She exhibits no tenderness.  Abdominal: Soft. Bowel sounds are normal. She exhibits no distension. There is tenderness (Right upper quadrant tenderness on palpation without guarding or rebound tenderness. Positive Murphy sign.). There is no rebound and no guarding.  Musculoskeletal: She exhibits no tenderness (Right shoulder with full range of motion, no reproducible pain.).  Neurological: She is alert.  Skin: No rash noted.  Psychiatric: She has a normal mood and affect.  Nursing note and vitals reviewed.   ED Course  Procedures (including critical care time)  4:11 PM Patient with persistent worsening right upper quadrant abdominal pain concerning for possible biliary etiology. Patient appears to be uncomfortable but no evidence of peritoneal sign. She has normal lipase and normal hepatic function panel. She is hyperglycemic with a blood sugar of 494 but normal anion gap. Plan to obtain abdominal ultrasound to rule out peri-disease. Insulin given to decrease her blood sugar. Low suspicion for cardiopulmonary etiology at this time although she does have a significant cardiac history.  8:49 PM Patient continued to endorse right upper quadrant abdominal pain despite receiving pain medication. Her workup has been fairly unremarkable with a normal abdominal ultrasound, labs are reassuring, her elevated blood sugar has normalized after receiving 10 units of insulin. Urine without evidence of urinary tract infection. No EKG changes and negative troponin therefore low  suspicion for cardiac disease. Otherwise suspicion is low for acute pathology, patient's pain is not well controlled, therefore abdominal and pelvis CT scan ordered to rule out emergent condition. Patient agrees with plan.  10:37 PM Abdominal and pelvis CT demonstrated no evidence of acute pathology. Patient was reassured. Patient will follow-up closely with her doctor for further management including high blood sugar. Return precautions discussed.  Labs Review Labs Reviewed  COMPREHENSIVE METABOLIC PANEL - Abnormal; Notable for the following:    Glucose, Bld 494 (*)    GFR calc non Af Amer 62 (*)    GFR calc Af Amer 72 (*)    All other components within normal limits  CBC WITH DIFFERENTIAL - Abnormal; Notable for the following:    Hemoglobin 15.1 (*)    All other components within normal limits  URINALYSIS, ROUTINE W REFLEX MICROSCOPIC - Abnormal; Notable for the following:    Specific Gravity, Urine 1.045 (*)    Glucose, UA >1000 (*)    Ketones, ur 15 (*)    All other components within normal limits  CBG MONITORING, ED - Abnormal; Notable for the following:    Glucose-Capillary 351 (*)    All other components within normal limits  CBG MONITORING, ED - Abnormal; Notable for the following:    Glucose-Capillary 301 (*)    All other components within normal limits  CBG MONITORING, ED - Abnormal; Notable for the following:    Glucose-Capillary 139 (*)    All other components within normal limits  LIPASE, BLOOD  URINE MICROSCOPIC-ADD ON  I-STAT TROPOININ, ED    Imaging Review [place] Abdomen Complete  11/25/2014   CLINICAL DATA:  Right upper quadrant pain.  EXAM: ULTRASOUND ABDOMEN COMPLETE  COMPARISON:  None.  FINDINGS: Gallbladder: No gallstones or wall thickening visualized. No sonographic Murphy sign noted.  Common bile duct: Diameter: 5.5 mm.  Liver: Mild diffuse hepatic steatosis without focal mass.  IVC: No abnormality visualized.  Pancreas: Visualized portion unremarkable.  Spleen:  Size and appearance within normal limits.  Right Kidney: Length: 14.1 cm. Echogenicity within normal limits. No mass or hydronephrosis visualized.  Left Kidney: Length: 11.7 cm. Echogenicity within normal limits. No mass or hydronephrosis visualized.  Abdominal aorta: No aneurysm visualized.  Other findings: None.  IMPRESSION: No acute hepatobiliary findings.  Mild diffuse hepatic steatosis.   Electronically Signed   By: Elberta Fortisaniel  Boyle M.D.   On: 11/25/2014 18:33   Ct Abdomen Pelvis W Contrast  11/25/2014   CLINICAL DATA:  RIGHT upper quadrant pain for 3 weeks.  EXAM: CT ABDOMEN AND PELVIS WITH CONTRAST  TECHNIQUE: Multidetector CT imaging of the abdomen and pelvis was performed using the standard protocol following bolus administration of intravenous contrast.  CONTRAST:  100mL OMNIPAQUE IOHEXOL 300 MG/ML  SOLN  COMPARISON:  Abdominal ultrasound November 25, 2014 at 1808 hours  FINDINGS: LUNG BASES: Included view of the lung bases are clear. Visualized heart and pericardium are unremarkable. Contrast in the thoracic esophagus may reflect reflux.  SOLID ORGANS: The spleen, gallbladder, pancreas and adrenal glands are unremarkable. The liver appears mildly enlarged, 19 cm in craniocaudad dimension.  GASTROINTESTINAL TRACT: The stomach, small and large bowel are normal in course and caliber without inflammatory changes. Normal appendix.  KIDNEYS/ URINARY TRACT: Kidneys are orthotopic, demonstrating symmetric enhancement. No nephrolithiasis, hydronephrosis or solid renal masses. The unopacified ureters are normal in course and caliber. Delayed imaging through the kidneys demonstrates symmetric prompt contrast excretion within the proximal urinary collecting system. Urinary bladder is partially distended and unremarkable.  PERITONEUM/RETROPERITONEUM: No intraperitoneal free fluid nor free air. Aortoiliac vessels are normal in course and caliber, mild calcific atherosclerosis. No lymphadenopathy by CT size criteria.  Status post hysterectomy.  SOFT TISSUE/OSSEOUS STRUCTURES: Nonsuspicious. Small fat containing umbilical hernia. Grade 1 L4-5 anterolisthesis without spondylolysis. Moderate broad-based disc bulge at L4-5, severe lower lumbar facet arthropathy, severe RIGHT L4-5 neural foraminal narrowing.  IMPRESSION: No acute intra-abdominal or pelvic process. Borderline hepatomegaly.  No urolithiasis.  Normal appendix.   Electronically Signed   By: Awilda Metroourtnay  Bloomer   On: 11/25/2014 22:09     EKG Interpretation None      Date: 11/25/2014  Rate: 86  Rhythm: normal sinus rhythm  QRS Axis: right  Intervals: normal  ST/T Wave abnormalities: nonspecific T wave changes  Conduction Disutrbances:none  Narrative Interpretation:   Old EKG Reviewed: unchanged    MDM   Final diagnoses:  RUQ pain  Hyperglycemia    BP 109/94 mmHg  Pulse 63  Temp(Src) 99 F (37.2 C) (Oral)  Resp 18  Ht 5\' 6"  (1.676 m)  SpO2 97%  I have reviewed nursing notes and vital signs. I personally reviewed the imaging tests through PACS system  I reviewed available ER/hospitalization  records thought the EMR     Fayrene Helper, PA-C 11/25/14 2239  Tilden Fossa, MD 11/26/14 0030

## 2014-11-25 NOTE — ED Notes (Signed)
CBG 139 mg/dL

## 2014-11-25 NOTE — ED Notes (Signed)
RUQ pain that is tender to touch and sharp pains to arm and breast. Been going on for month. Pt started feeling nauseated today. Pt states pain is worsening at night; denies urinary symptoms.

## 2014-11-25 NOTE — Discharge Instructions (Signed)
Please follow-up closely with her primary care provider for further evaluation of your abdominal pain. Your blood sugar is high today, you will need to discuss with your doctor on treatment plan to decrease the risk of hyperglycemia.  Abdominal Pain, Women Abdominal (stomach, pelvic, or belly) pain can be caused by many things. It is important to tell your doctor:  The location of the pain.  Does it come and go or is it present all the time?  Are there things that start the pain (eating certain foods, exercise)?  Are there other symptoms associated with the pain (fever, nausea, vomiting, diarrhea)? All of this is helpful to know when trying to find the cause of the pain. CAUSES   Stomach: virus or bacteria infection, or ulcer.  Intestine: appendicitis (inflamed appendix), regional ileitis (Crohn's disease), ulcerative colitis (inflamed colon), irritable bowel syndrome, diverticulitis (inflamed diverticulum of the colon), or cancer of the stomach or intestine.  Gallbladder disease or stones in the gallbladder.  Kidney disease, kidney stones, or infection.  Pancreas infection or cancer.  Fibromyalgia (pain disorder).  Diseases of the female organs:  Uterus: fibroid (non-cancerous) tumors or infection.  Fallopian tubes: infection or tubal pregnancy.  Ovary: cysts or tumors.  Pelvic adhesions (scar tissue).  Endometriosis (uterus lining tissue growing in the pelvis and on the pelvic organs).  Pelvic congestion syndrome (female organs filling up with blood just before the menstrual period).  Pain with the menstrual period.  Pain with ovulation (producing an egg).  Pain with an IUD (intrauterine device, birth control) in the uterus.  Cancer of the female organs.  Functional pain (pain not caused by a disease, may improve without treatment).  Psychological pain.  Depression. DIAGNOSIS  Your doctor will decide the seriousness of your pain by doing an  examination.  Blood tests.  X-rays.  Ultrasound.  CT scan (computed tomography, special type of X-ray).  MRI (magnetic resonance imaging).  Cultures, for infection.  Barium enema (dye inserted in the large intestine, to better view it with X-rays).  Colonoscopy (looking in intestine with a lighted tube).  Laparoscopy (minor surgery, looking in abdomen with a lighted tube).  Major abdominal exploratory surgery (looking in abdomen with a large incision). TREATMENT  The treatment will depend on the cause of the pain.   Many cases can be observed and treated at home.  Over-the-counter medicines recommended by your caregiver.  Prescription medicine.  Antibiotics, for infection.  Birth control pills, for painful periods or for ovulation pain.  Hormone treatment, for endometriosis.  Nerve blocking injections.  Physical therapy.  Antidepressants.  Counseling with a psychologist or psychiatrist.  Minor or major surgery. HOME CARE INSTRUCTIONS   Do not take laxatives, unless directed by your caregiver.  Take over-the-counter pain medicine only if ordered by your caregiver. Do not take aspirin because it can cause an upset stomach or bleeding.  Try a clear liquid diet (broth or water) as ordered by your caregiver. Slowly move to a bland diet, as tolerated, if the pain is related to the stomach or intestine.  Have a thermometer and take your temperature several times a day, and record it.  Bed rest and sleep, if it helps the pain.  Avoid sexual intercourse, if it causes pain.  Avoid stressful situations.  Keep your follow-up appointments and tests, as your caregiver orders.  If the pain does not go away with medicine or surgery, you may try:  Acupuncture.  Relaxation exercises (yoga, meditation).  Group therapy.  Counseling. SEEK  MEDICAL CARE IF:   You notice certain foods cause stomach pain.  Your home care treatment is not helping your pain.  You  need stronger pain medicine.  You want your IUD removed.  You feel faint or lightheaded.  You develop nausea and vomiting.  You develop a rash.  You are having side effects or an allergy to your medicine. SEEK IMMEDIATE MEDICAL CARE IF:   Your pain does not go away or gets worse.  You have a fever.  Your pain is felt only in portions of the abdomen. The right side could possibly be appendicitis. The left lower portion of the abdomen could be colitis or diverticulitis.  You are passing blood in your stools (bright red or black tarry stools, with or without vomiting).  You have blood in your urine.  You develop chills, with or without a fever.  You pass out. MAKE SURE YOU:   Understand these instructions.  Will watch your condition.  Will get help right away if you are not doing well or get worse. Document Released: 09/02/2007 Document Revised: 03/22/2014 Document Reviewed: 09/22/2009 [facility] Patient Information 2015 [place], [place]. This information is not intended to replace advice given to you by your health care provider. Make sure you discuss any questions you have with your health care provider.

## 2014-12-30 ENCOUNTER — Encounter (HOSPITAL_COMMUNITY): Payer: Self-pay | Admitting: Emergency Medicine

## 2014-12-30 ENCOUNTER — Emergency Department (HOSPITAL_COMMUNITY)
Admission: EM | Admit: 2014-12-30 | Discharge: 2014-12-30 | Disposition: A | Discharge status: Home / Self Care | Payer: Medicare HMO | Attending: Emergency Medicine | Admitting: Emergency Medicine

## 2014-12-30 DIAGNOSIS — E669 Obesity, unspecified: Secondary | ICD-10-CM | POA: Insufficient documentation

## 2014-12-30 DIAGNOSIS — I10 Essential (primary) hypertension: Secondary | ICD-10-CM | POA: Insufficient documentation

## 2014-12-30 DIAGNOSIS — Z792 Long term (current) use of antibiotics: Secondary | ICD-10-CM | POA: Insufficient documentation

## 2014-12-30 DIAGNOSIS — Z90710 Acquired absence of both cervix and uterus: Secondary | ICD-10-CM | POA: Insufficient documentation

## 2014-12-30 DIAGNOSIS — Z79899 Other long term (current) drug therapy: Secondary | ICD-10-CM | POA: Insufficient documentation

## 2014-12-30 DIAGNOSIS — Z7982 Long term (current) use of aspirin: Secondary | ICD-10-CM | POA: Diagnosis not present

## 2014-12-30 DIAGNOSIS — N390 Urinary tract infection, site not specified: Secondary | ICD-10-CM | POA: Diagnosis not present

## 2014-12-30 DIAGNOSIS — J45909 Unspecified asthma, uncomplicated: Secondary | ICD-10-CM | POA: Insufficient documentation

## 2014-12-30 DIAGNOSIS — F419 Anxiety disorder, unspecified: Secondary | ICD-10-CM | POA: Diagnosis not present

## 2014-12-30 DIAGNOSIS — B9689 Other specified bacterial agents as the cause of diseases classified elsewhere: Secondary | ICD-10-CM

## 2014-12-30 DIAGNOSIS — Z9861 Coronary angioplasty status: Secondary | ICD-10-CM | POA: Insufficient documentation

## 2014-12-30 DIAGNOSIS — R102 Pelvic and perineal pain: Secondary | ICD-10-CM | POA: Diagnosis present

## 2014-12-30 DIAGNOSIS — E119 Type 2 diabetes mellitus without complications: Secondary | ICD-10-CM | POA: Insufficient documentation

## 2014-12-30 DIAGNOSIS — F329 Major depressive disorder, single episode, unspecified: Secondary | ICD-10-CM | POA: Diagnosis not present

## 2014-12-30 DIAGNOSIS — N760 Acute vaginitis: Secondary | ICD-10-CM | POA: Insufficient documentation

## 2014-12-30 DIAGNOSIS — I2510 Atherosclerotic heart disease of native coronary artery without angina pectoris: Secondary | ICD-10-CM | POA: Insufficient documentation

## 2014-12-30 LAB — CBC
HCT: 43 % (ref 36.0–46.0)
Hemoglobin: 14.1 g/dL (ref 12.0–15.0)
MCH: 30.9 pg (ref 26.0–34.0)
MCHC: 32.8 g/dL (ref 30.0–36.0)
MCV: 94.3 fL (ref 78.0–100.0)
PLATELETS: 242 10*3/uL (ref 150–400)
RBC: 4.56 MIL/uL (ref 3.87–5.11)
RDW: 12.3 % (ref 11.5–15.5)
WBC: 7.7 10*3/uL (ref 4.0–10.5)

## 2014-12-30 LAB — BASIC METABOLIC PANEL
Anion gap: 10 (ref 5–15)
BUN: 15 mg/dL (ref 6–23)
CALCIUM: 9.4 mg/dL (ref 8.4–10.5)
CO2: 26 mmol/L (ref 19–32)
CREATININE: 0.78 mg/dL (ref 0.50–1.10)
Chloride: 103 mmol/L (ref 96–112)
GFR calc Af Amer: >90 mL/min (ref >90)
GLUCOSE: 219 mg/dL — AB (ref 70–99)
Potassium: 3.6 mmol/L (ref 3.5–5.1)
SODIUM: 139 mmol/L (ref 135–145)

## 2014-12-30 LAB — URINALYSIS, ROUTINE W REFLEX MICROSCOPIC
Bilirubin Urine: NEGATIVE
Glucose, UA: 500 mg/dL — AB
Ketones, ur: NEGATIVE mg/dL
NITRITE: POSITIVE — AB
Protein, ur: 100 mg/dL — AB
SPECIFIC GRAVITY, URINE: 1.027 (ref 1.005–1.030)
Urobilinogen, UA: 0.2 mg/dL (ref 0.0–1.0)
pH: 5 (ref 5.0–8.0)

## 2014-12-30 LAB — WET PREP, GENITAL
Trich, Wet Prep: NONE SEEN
WBC, Wet Prep HPF POC: NONE SEEN
Yeast Wet Prep HPF POC: NONE SEEN

## 2014-12-30 LAB — URINE MICROSCOPIC-ADD ON

## 2014-12-30 MED ORDER — METRONIDAZOLE 500 MG PO TABS: 500.0000 mg | ORAL_TABLET | Freq: Two times a day (BID) | ORAL | Status: AC

## 2014-12-30 MED ORDER — DOXYCYCLINE HYCLATE 100 MG PO TABS
100.0000 mg | ORAL_TABLET | Freq: Once | ORAL | Status: AC
  Administered 2014-12-30: 100 mg via ORAL
  Filled 2014-12-30: qty 1

## 2014-12-30 MED ORDER — AZITHROMYCIN 250 MG PO TABS
1000.0000 mg | ORAL_TABLET | Freq: Once | ORAL | Status: AC
  Administered 2014-12-30: 1000 mg via ORAL
  Filled 2014-12-30: qty 4

## 2014-12-30 MED ORDER — DOXYCYCLINE HYCLATE 100 MG PO CAPS: 100.0000 mg | ORAL_CAPSULE | Freq: Two times a day (BID) | ORAL | Status: AC

## 2014-12-30 NOTE — ED Provider Notes (Signed)
CSN: [id number]     Arrival date & time 12/30/14  1330 History   First MD Initiated Contact with Patient 12/30/14 1522     Chief Complaint  Patient presents with  . Pelvic Pain  . Dysuria     (Consider location/radiation/quality/duration/timing/severity/associated sxs/prior Treatment) HPI   54 year old female with history of non-insulin-dependent diabetes, hypertension, depression, CAD presents with complaints of pelvic pain and urinary hesitancy. Patient reports his last night she has had urinary frequency and urgency urinating only a small amount each time. Today she developed generalized lower abdominal pain which she described as sharp sensation, worsening with movement, pain is in moderate severity. No associated fever, chills, nausea vomiting diarrhea, chest pain, shortness of breath, hematuria, vaginal bleeding, vaginal discharge, or rash. She has had a hysterectomy. Denies any prior history of STD. Patient does admits that she has sexual intercourse with her ex-husband a week ago.  Past Medical History  Diagnosis Date  . Diabetes mellitus   . Hypertension   . Depression   . Anxiety   . Asthma   . Coronary artery disease   . Obesity (BMI 30-39.9) 03/16/2013  . 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 Abdominal hysterectomy  2002  . Cervical laminectomy  2007  . Breast surgery      breast reduction 2002  . Coronary stent placement    . Knee surgery    . Left leg fracture    . Left heart catheterization with coronary angiogram N/A 01/15/2012    Procedure: LEFT HEART CATHETERIZATION WITH CORONARY ANGIOGRAM;  Surgeon: Corky Crafts, MD;  Location: [facility] CATH LAB;  Service: Cardiovascular;  Laterality: N/A;  . Percutaneous coronary stent intervention (pci-s) N/A 01/15/2012    Procedure: PERCUTANEOUS CORONARY STENT INTERVENTION (PCI-S);  Surgeon: Corky Crafts, MD;  Location: [facility] CATH LAB;  Service:  Cardiovascular;  Laterality: N/A;  . Left heart catheterization with coronary angiogram N/A 10/24/2012    Procedure: LEFT HEART CATHETERIZATION WITH CORONARY ANGIOGRAM;  Surgeon: Corky Crafts, MD;  Location: [facility] CATH LAB;  Service: Cardiovascular;  Laterality: N/A;   No family history on file. History  Substance Use Topics  . Smoking status: Current Every Day Smoker -- 1.00 packs/day for 30 years    Types: Cigarettes  . Smokeless tobacco: Not on file  . Alcohol Use: Yes     Comment: occ   OB History    No data available     Review of Systems  All other systems reviewed and are negative.     Allergies  Review of patient's allergies indicates no known allergies.  Home Medications   Prior to Admission medications   Medication Sig Start Date End Date Taking? Authorizing Provider  aspirin 81 MG chewable tablet Chew 81 mg by mouth daily.    Historical Provider, MD  clindamycin (CLEOCIN) 300 MG capsule Take 1 capsule (300 mg total) by mouth 4 (four) times daily. X 7 days Patient not taking: Reported on 11/25/2014 05/04/14   Loren Racer, MD  glipiZIDE-metformin (METAGLIP) 5-500 MG per tablet Take 2 tablets by mouth 2 (two) times daily before a meal. 03/17/13   Corky Crafts, MD  metoprolol tartrate (LOPRESSOR) 25 MG tablet Take 1 tablet (25 mg total) by mouth 2 (two) times daily. 03/17/13   Corky Crafts, MD  Multiple Vitamins-Minerals (MULTIVITAMIN WITH MINERALS) tablet Take 1 tablet by mouth daily.    Historical Provider, MD  nitroGLYCERIN (NITROSTAT) 0.4  MG SL tablet Place 0.4 mg under the tongue every 5 (five) minutes x 2 doses as needed. For chest pain    Historical Provider, MD  nystatin (MYCOSTATIN/NYSTOP) 100000 UNIT/GM POWD Apply twice a day to the groin area. Patient not taking: Reported on 11/25/2014 05/04/14   Loren Raceravid Yelverton, MD  sertraline (ZOLOFT) 50 MG tablet Take 50 mg by mouth daily.    Historical Provider, MD   BP 169/83 mmHg  Pulse 81  Temp(Src)  98.5 F (36.9 C) (Oral)  Resp 22  SpO2 95% Physical Exam  Constitutional: She appears well-developed and well-nourished. No distress.  HENT:  Head: Atraumatic.  Eyes: Conjunctivae are normal.  Neck: Neck supple.  Cardiovascular: Normal rate and regular rhythm.   Pulmonary/Chest: Effort normal and breath sounds normal. No respiratory distress. She exhibits no tenderness.  Abdominal: Soft. Bowel sounds are normal. She exhibits no distension. There is tenderness (Mild pubic tenderness to palpation without guarding or rebound tenderness. No peritoneal sign.).  Genitourinary:  Chaperone present:  No inguinal lymphadenopathy or inguinal hernia noted. A benign skin tag noted to the right medial thigh. No more external genitalia. Vaginal vault with functional discharge without any significant odor. No discomfort with speculum insertion. Unable to visualized cervical os as patient has a hysterectomy. On bimanual examination patient has right adnexal tenderness. No obvious mass noted.  Neurological: She is alert.  Skin: No rash noted.  Psychiatric: She has a normal mood and affect.  Nursing note and vitals reviewed.   ED Course  Procedures (including critical care time)  3:34 PM Patient with dysuria. UA is consistence with a urinary tract infection, patient will be treated with doxycycline. Urine culture sent. She is having pelvic pain and is sexually active, we'll perform pelvic examination.  4:44 PM Patient will be treated for urinary tract infection with doxycycline and evidence of bacterial vaginosis with Flagyl.  Labs Review Labs Reviewed  BASIC METABOLIC PANEL - Abnormal; Notable for the following:    Glucose, Bld 219 (*)    All other components within normal limits  URINALYSIS, ROUTINE W REFLEX MICROSCOPIC - Abnormal; Notable for the following:    Color, Urine AMBER (*)    APPearance TURBID (*)    Glucose, UA 500 (*)    Hgb urine dipstick LARGE (*)    Protein, ur 100 (*)     Nitrite POSITIVE (*)    Leukocytes, UA MODERATE (*)    All other components within normal limits  URINE MICROSCOPIC-ADD ON - Abnormal; Notable for the following:    Bacteria, UA MANY (*)    All other components within normal limits  CBC    Imaging Review No results found.   EKG Interpretation None      MDM   Final diagnoses:  UTI (lower urinary tract infection)  BV (bacterial vaginosis)    BP 169/83 mmHg  Pulse 81  Temp(Src) 98.5 F (36.9 C) (Oral)  Resp 22  SpO2 95%     Fayrene HelperBowie Forest Redwine, PA-C 12/30/14 1645  John Young Berryavid Wofford III, MD 12/30/14 1902

## 2014-12-30 NOTE — Discharge Instructions (Signed)
Urinary Tract Infection A urinary tract infection (UTI) can occur any place along the urinary tract. The tract includes the kidneys, ureters, bladder, and urethra. A type of germ called bacteria often causes a UTI. UTIs are often helped with antibiotic medicine.  HOME CARE   If given, take antibiotics as told by your doctor. Finish them even if you start to feel better.  Drink enough fluids to keep your pee (urine) clear or pale yellow.  Avoid tea, drinks with caffeine, and bubbly (carbonated) drinks.  Pee often. Avoid holding your pee in for a long time.  Pee before and after having sex (intercourse).  Wipe from front to back after you poop (bowel movement) if you are a woman. Use each tissue only once. GET HELP RIGHT AWAY IF:   You have back pain.  You have lower belly (abdominal) pain.  You have chills.  You feel sick to your stomach (nauseous).  You throw up (vomit).  Your burning or discomfort with peeing does not go away.  You have a fever.  Your symptoms are not better in 3 days. MAKE SURE YOU:   Understand these instructions.  Will watch your condition.  Will get help right away if you are not doing well or get worse. Document Released: 04/23/2008 Document Revised: 07/30/2012 Document Reviewed: 06/05/2012 [facility] Patient Information 2015 [place], [place]. This information is not intended to replace advice given to you by your health care provider. Make sure you discuss any questions you have with your health care provider. Bacterial Vaginosis Bacterial vaginosis is an infection of the vagina. It happens when too many of certain germs (bacteria) grow in the vagina. HOME CARE  Take your medicine as told by your doctor.  Finish your medicine even if you start to feel better.  Do not have sex until you finish your medicine and are better.  Tell your sex partner that you have an infection. They should see their doctor for treatment.  Practice safe sex. Use  condoms. Have only one sex partner. GET HELP IF:  You are not getting better after 3 days of treatment.  You have more grey fluid (discharge) coming from your vagina than before.  You have more pain than before.  You have a fever. MAKE SURE YOU:   Understand these instructions.  Will watch your condition.  Will get help right away if you are not doing well or get worse. Document Released: 08/14/2008 Document Revised: 08/26/2013 Document Reviewed: 06/17/2013 [facility] Patient Information 2015 [place], [place]. This information is not intended to replace advice given to you by your health care provider. Make sure you discuss any questions you have with your health care provider.

## 2014-12-30 NOTE — ED Notes (Signed)
Per pt, states pelvic pain with urinary hesitancy since yesterday

## 2014-12-31 LAB — GC/CHLAMYDIA PROBE AMP (~~LOC~~) NOT AT ARMC
Chlamydia: NEGATIVE
Neisseria Gonorrhea: NEGATIVE

## 2014-12-31 LAB — RPR: RPR Ser Ql: NONREACTIVE

## 2014-12-31 LAB — HIV ANTIBODY (ROUTINE TESTING W REFLEX): HIV Screen 4th Generation wRfx: NONREACTIVE

## 2015-01-02 LAB — URINE CULTURE: Colony Count: >=100000

## 2015-01-03 ENCOUNTER — Telehealth (HOSPITAL_COMMUNITY): Payer: Self-pay

## 2015-01-03 NOTE — ED Notes (Signed)
Post ED Visit - Positive Culture Follow-up: Successful Patient Follow-Up  Culture assessed and recommendations reviewed by: []  Wes Dulaney, Pharm.D., BCPS []  Celedonio MiyamotoJeremy Frens, [address]Ph[address] BCPS [x]  Georgina PillionElizabeth Martin, [address]Pharm[address]D., BCPS []  ErnestMinh Pham, [address]Pharm[address]D., BCPS, AAHIVP []  Estella HuskMichelle Turner, Pharm.D., BCPS, AAHIVP []  Red ChristiansSamson Lee, Pharm.D. []  Cassie Roseanne RenoStewart, VermontPharm.D.  Positive urine culture  []  Patient discharged without antimicrobial prescription and treatment is now indicated [x]  Organism is resistant to prescribed ED discharge antimicrobial []  Patient with positive blood cultures  Changes discussed with ED provider: Trixie DredgeEmily West New antibiotic prescription DC Doxy and start Bactrim DS 1 Tab bid x 5 days.  Called to unk  Contacted patient, date 01/03/2015 time 1456   Ashley JacobsFesterman, Alaynna Kerwood C 01/03/2015, 2:53 PM

## 2015-01-03 NOTE — Progress Notes (Signed)
ED Antimicrobial Stewardship Positive Culture Follow Up   Pennie Rushingeggy L Bracher is an 54 y.o. female [facility] with a chief complaint of pelvic pain and dysuria Chief Complaint  Patient presents with  . Pelvic Pain  . Dysuria    Recent Results (from the past 720 hour(s))  Urine culture     Status: None   Collection Time: 12/30/14  3:36 PM  Result Value Ref Range Status   Specimen Description URINE, CLEAN CATCH  Final   Special Requests NONE  Final   Colony Count   Final    >=100,000 COLONIES/ML [facility]formed at [facility]    Culture   Final    ENTEROBACTER AEROGENES [facility]formed at [facility]    Report Status 01/02/2015 FINAL  Final   Organism ID, Bacteria ENTEROBACTER AEROGENES  Final      Susceptibility   Enterobacter aerogenes - MIC*    CEFAZOLIN >=64 RESISTANT Resistant     CEFTRIAXONE <=1 SENSITIVE Sensitive     CIPROFLOXACIN <=0.25 SENSITIVE Sensitive     GENTAMICIN <=1 SENSITIVE Sensitive     LEVOFLOXACIN <=0.12 SENSITIVE Sensitive     NITROFURANTOIN 64 INTERMEDIATE Intermediate     TOBRAMYCIN <=1 SENSITIVE Sensitive     TRIMETH/SULFA <=20 SENSITIVE Sensitive     PIP/TAZO <=4 SENSITIVE Sensitive     * ENTEROBACTER AEROGENES  Wet prep, genital     Status: Abnormal   Collection Time: 12/30/14  4:00 PM  Result Value Ref Range Status   Yeast Wet Prep HPF POC NONE SEEN NONE SEEN Final   Trich, Wet Prep NONE SEEN NONE SEEN Final   Clue Cells Wet Prep HPF POC MODERATE (A) NONE SEEN Final   WBC, Wet Prep HPF POC NONE SEEN NONE SEEN Final    [x]  Treated with Doxycycline, organism not covered by the prescribed antimicrobial  53 YOF who presented on 2/11 with pelvic pain, dysuria, and lower abdominal pain. Wet prep was positive for clue cells and the patient was given Flagyl for BV and per MD note, Doxy for UTI. Urine cultures have now grown out Enterobacter not covered by Doxy. The patient was also negative for chlamydia, gonorrhea,  and HIV.  New antibiotic prescription: D/c Doxy and change to Bactrim DS 1 tab bid for 5 days, continue Flagyl as prescribed  ED Provider: Trixie DredgeEmily West, PA-C  Rolley SimsMartin, Grant Henkes Ann 01/03/2015, 11:00 AM Infectious Diseases Pharmacist Phone# [phone]

## 2015-01-04 ENCOUNTER — Telehealth (HOSPITAL_BASED_OUTPATIENT_CLINIC_OR_DEPARTMENT_OTHER): Payer: Self-pay | Admitting: Emergency Medicine

## 2015-01-04 NOTE — Telephone Encounter (Signed)
Post ED Visit - Positive Culture Follow-up: Successful Patient Follow-Up  Culture assessed and recommendations reviewed by: []  Wes Dulaney, Pharm.D., BCPS []  Celedonio MiyamotoJeremy Frens, Pharm.D., BCPS [x]  Georgina PillionElizabeth Martin, Pharm.D., BCPS []  Silver CreekMinh Pham, [address]Pharm[address]D., BCPS, AAHIVP []  Estella HuskMichelle Turner, Pharm.D., BCPS, AAHIVP []  Red ChristiansSamson Lee, Pharm.D. []  Tennis Mustassie Stewart, Pharm.D.  Positive urine culture Enterobacter  []  Patient discharged without antimicrobial prescription and treatment is now indicated [x]  Organism is resistant to prescribed ED discharge antimicrobial []  Patient with positive blood cultures  Changes discussed with ED provider: Trixie DredgeEmily West PA New antibiotic prescription Bactrim DS 1 tab bid x 5 days [phone]  Contacted patient, date 01/04/15 1240   Berle MullMiller, Candace Begue 01/04/2015, 12:42 PM

## 2015-02-04 ENCOUNTER — Emergency Department (HOSPITAL_COMMUNITY): Payer: Medicare HMO

## 2015-02-04 ENCOUNTER — Emergency Department (HOSPITAL_COMMUNITY)
Admission: EM | Admit: 2015-02-04 | Discharge: 2015-02-04 | Disposition: A | Discharge status: Home / Self Care | Payer: Medicare HMO | Attending: Emergency Medicine | Admitting: Emergency Medicine

## 2015-02-04 ENCOUNTER — Encounter (HOSPITAL_COMMUNITY): Payer: Self-pay | Admitting: Family Medicine

## 2015-02-04 DIAGNOSIS — Z9861 Coronary angioplasty status: Secondary | ICD-10-CM | POA: Insufficient documentation

## 2015-02-04 DIAGNOSIS — I2510 Atherosclerotic heart disease of native coronary artery without angina pectoris: Secondary | ICD-10-CM | POA: Diagnosis not present

## 2015-02-04 DIAGNOSIS — I10 Essential (primary) hypertension: Secondary | ICD-10-CM | POA: Insufficient documentation

## 2015-02-04 DIAGNOSIS — Z7982 Long term (current) use of aspirin: Secondary | ICD-10-CM | POA: Diagnosis not present

## 2015-02-04 DIAGNOSIS — E669 Obesity, unspecified: Secondary | ICD-10-CM | POA: Insufficient documentation

## 2015-02-04 DIAGNOSIS — M1710 Unilateral primary osteoarthritis, unspecified knee: Secondary | ICD-10-CM

## 2015-02-04 DIAGNOSIS — E119 Type 2 diabetes mellitus without complications: Secondary | ICD-10-CM | POA: Insufficient documentation

## 2015-02-04 DIAGNOSIS — M25552 Pain in left hip: Secondary | ICD-10-CM | POA: Diagnosis not present

## 2015-02-04 DIAGNOSIS — I252 Old myocardial infarction: Secondary | ICD-10-CM | POA: Diagnosis not present

## 2015-02-04 DIAGNOSIS — J45909 Unspecified asthma, uncomplicated: Secondary | ICD-10-CM | POA: Diagnosis not present

## 2015-02-04 DIAGNOSIS — M25562 Pain in left knee: Secondary | ICD-10-CM | POA: Diagnosis not present

## 2015-02-04 DIAGNOSIS — M25559 Pain in unspecified hip: Secondary | ICD-10-CM

## 2015-02-04 DIAGNOSIS — F329 Major depressive disorder, single episode, unspecified: Secondary | ICD-10-CM | POA: Diagnosis not present

## 2015-02-04 DIAGNOSIS — Z9889 Other specified postprocedural states: Secondary | ICD-10-CM | POA: Insufficient documentation

## 2015-02-04 DIAGNOSIS — Z87828 Personal history of other (healed) physical injury and trauma: Secondary | ICD-10-CM | POA: Insufficient documentation

## 2015-02-04 DIAGNOSIS — F419 Anxiety disorder, unspecified: Secondary | ICD-10-CM | POA: Diagnosis not present

## 2015-02-04 DIAGNOSIS — Z720 Tobacco use: Secondary | ICD-10-CM | POA: Insufficient documentation

## 2015-02-04 DIAGNOSIS — Z79899 Other long term (current) drug therapy: Secondary | ICD-10-CM | POA: Diagnosis not present

## 2015-02-04 MED ORDER — IBUPROFEN-FAMOTIDINE 800-26.6 MG PO TABS: 1.0000 | ORAL_TABLET | Freq: Three times a day (TID) | ORAL | Status: AC

## 2015-02-04 NOTE — ED Notes (Signed)
Pt c/o left hip pain radiating to right knee. Hx of traumatic injury in remote past. States has been having pain x 1 months.

## 2015-02-04 NOTE — ED Notes (Signed)
Pt here for left hip pain and knee pain. sts she has been having issues since a fall a few months ago.

## 2015-02-04 NOTE — ED Provider Notes (Signed)
CSN: [id number]     Arrival date & time 02/04/15  0932 History  This chart was scribed for non-physician practitioner, Teressa Lower, NP, working with Blane Ohara, MD by Charline Bills, ED Scribe. This patient was seen in room TR06C/TR06C and the patient's care was started at 9:52 AM.   Chief Complaint  Patient presents with  . Hip Pain   The history is provided by the patient. No language interpreter was used.    HPI Comments: Debbie [name] is a 54 y.o. female who presents to the Emergency Department complaining of persistent L hip pain for the past 3 months. Pt reports a fall around Christmas times in which she states that her L leg gave out. Pt reports associated L knee and L lower leg pain. She has been treating with [facility] without relief. Pt reports h/o L leg fracture.  Past Medical History  Diagnosis Date  . Diabetes mellitus   . Hypertension   . Depression   . Anxiety   . Asthma   . Coronary artery disease   . Obesity (BMI 30-39.9) 03/16/2013  . 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 Abdominal hysterectomy  2002  . Cervical laminectomy  2007  . Breast surgery      breast reduction 2002  . Coronary stent placement    . Knee surgery    . Left leg fracture    . Left heart catheterization with coronary angiogram N/A 01/15/2012    Procedure: LEFT HEART CATHETERIZATION WITH CORONARY ANGIOGRAM;  Surgeon: Corky Crafts, MD;  Location[facility] CATH LAB;  Service: Cardiovascular;  Laterality: N/A;  . Percutaneous coronary stent intervention (pci-s) N/A 01/15/2012    Procedure: PERCUTANEOUS CORONARY STENT INTERVENTION (PCI-S);  Surgeon: Corky Crafts, MD;  Location: [facility] CATH LAB;  Service: Cardiovascular;  Laterality: N/A;  . Left heart catheterization with coronary angiogram N/A 10/24/2012    Procedure: LEFT HEART CATHETERIZATION WITH CORONARY ANGIOGRAM;  Surgeon: Corky Crafts, MD;  Location: [facility]  CATH LAB;  Service: Cardiovascular;  Laterality: N/A;   History reviewed. No pertinent family history. History  Substance Use Topics  . Smoking status: Current Every Day Smoker -- 1.00 packs/day for 30 years    Types: Cigarettes  . Smokeless tobacco: Not on file  . Alcohol Use: Yes     Comment: occ   OB History    No data available     Review of Systems  Musculoskeletal: Positive for myalgias and arthralgias.   Allergies  Review of patient's allergies indicates no known allergies.  Home Medications   Prior to Admission medications   Medication Sig Start Date End Date Taking? Authorizing Provider  ASA-APAP-Salicyl-Caff TABS Take 2 tablets by mouth daily as needed (pain).    Historical Provider, MD  aspirin 81 MG chewable tablet Chew 81 mg by mouth daily.    Historical Provider, MD  citalopram (CELEXA) 20 MG tablet Take 20 mg by mouth at bedtime.    Historical Provider, MD  clindamycin (CLEOCIN) 300 MG capsule Take 1 capsule (300 mg total) by mouth 4 (four) times daily. X 7 days Patient not taking: Reported on 11/25/2014 05/04/14   Loren Racer, MD  doxycycline (VIBRAMYCIN) 100 MG capsule Take 1 capsule (100 mg total) by mouth 2 (two) times daily. 12/30/14   Fayrene Helper, PA-C  glipiZIDE-metformin (METAGLIP) 5-500 MG per tablet Take 2 tablets by mouth 2 (two) times daily before a meal. 03/17/13  Corky CraftsJayadeep S Varanasi, MD  lisinopril (PRINIVIL,ZESTRIL) 10 MG tablet Take 10 mg by mouth daily.    Historical Provider, MD  metoprolol tartrate (LOPRESSOR) 25 MG tablet Take 1 tablet (25 mg total) by mouth 2 (two) times daily. 03/17/13   Corky CraftsJayadeep S Varanasi, MD  metroNIDAZOLE (FLAGYL) 500 MG tablet Take 1 tablet (500 mg total) by mouth 2 (two) times daily. 12/30/14   Fayrene HelperBowie Tran, PA-C  Multiple Vitamins-Minerals (MULTIVITAMIN WITH MINERALS) tablet Take 1 tablet by mouth daily.    Historical Provider, MD  nitroGLYCERIN (NITROSTAT) 0.4 MG SL tablet Place 0.4 mg under the tongue every 5 (five) minutes  x 2 doses as needed. For chest pain    Historical Provider, MD  nystatin (MYCOSTATIN/NYSTOP) 100000 UNIT/GM POWD Apply twice a day to the groin area. Patient not taking: Reported on 11/25/2014 05/04/14   Loren Raceravid Yelverton, MD  sertraline (ZOLOFT) 50 MG tablet Take 50 mg by mouth daily.    Historical Provider, MD  traZODone (DESYREL) 100 MG tablet Take 100 mg by mouth at bedtime.    Historical Provider, MD   BP 158/72 mmHg  Pulse 98  Temp(Src) 98.3 F (36.8 C)  Resp 18  SpO2 96% Physical Exam  Constitutional: She is oriented to person, place, and time. She appears well-developed and well-nourished. No distress.  HENT:  Head: Normocephalic and atraumatic.  Eyes: Conjunctivae and EOM are normal.  Neck: Neck supple. No tracheal deviation present.  Cardiovascular: Normal rate.   Pulmonary/Chest: Effort normal. No respiratory distress.  Musculoskeletal: Normal range of motion.  Full ROM. No swelling. Pulses intact.   Neurological: She is alert and oriented to person, place, and time.  Skin: Skin is warm and dry.  Psychiatric: She has a normal mood and affect. Her behavior is normal.  Nursing note and vitals reviewed.  ED Course  Procedures (including critical care time) DIAGNOSTIC STUDIES: Oxygen Saturation is 96% on RA, normal by my interpretation.    COORDINATION OF CARE: 9:57 AM-Discussed treatment plan which includes XRs with pt at bedside and pt agreed to plan.   Labs Review Labs Reviewed - No data to display  Imaging Review Dg Knee Complete 4 Views Left  02/04/2015   CLINICAL DATA:  Anterior knee pain. Remote injury. The left knee gives out.  EXAM: LEFT KNEE - COMPLETE 4+ VIEW  COMPARISON:  MRI of the left knee 04/14/2010. Left knee radiographs 02/16/2010.  FINDINGS: Hardware has been removed. Cortical thickening in the proximal tibias compatible with the remote injury. No acute bone or soft tissue abnormality is present. Mild degenerative changes are present in the patellofemoral  compartment. Chondrocalcinosis is present in the lateral meniscus. Atherosclerotic calcifications are present.  IMPRESSION: 1. Remote trauma of the proximal left tibia. The hardware has been removed. 2. Mild degenerative changes throughout the left knee. 3. Atherosclerosis. 4. No acute abnormality. 5. 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   Electronically Signed   By: Marin Robertshristopher  Mattern M.D.   On: 02/04/2015 10:39   Dg Hip Unilat With Pelvis 2-3 Views Left  02/04/2015   CLINICAL DATA:  Pain with weight-bearing. Fall approximately 1 month prior  EXAM: LEFT HIP (WITH PELVIS) 2-3 VIEWS  COMPARISON:  None.  FINDINGS: Frontal pelvis as well as frontal and lateral left hip images were obtained. There is no demonstrable fracture or dislocation. Joint spaces appear intact. No erosive change.  IMPRESSION: No fracture or dislocation.  No appreciable arthropathic change.   Electronically Signed   By: Bretta BangWilliam  Woodruff  III M.D.   On: 02/04/2015 10:38    EKG Interpretation None      MDM   Final diagnoses:  Hip pain, left  Arthritis of knee    Will given duexis per pt request as she states that it has helped in the past;pt given ortho referral  I personally performed the services described in this documentation, which was scribed in my presence. The recorded information has been reviewed and is accurate.    Teressa Lower, NP 02/04/15 1056  Blane Ohara, MD 02/04/15 [phone]

## 2015-02-04 NOTE — Discharge Instructions (Signed)

## 2015-02-10 ENCOUNTER — Emergency Department (HOSPITAL_COMMUNITY)
Admission: EM | Admit: 2015-02-10 | Discharge: 2015-02-10 | Disposition: A | Discharge status: Home / Self Care | Payer: Medicare HMO | Attending: Emergency Medicine | Admitting: Emergency Medicine

## 2015-02-10 ENCOUNTER — Emergency Department (HOSPITAL_COMMUNITY): Payer: Medicare HMO

## 2015-02-10 ENCOUNTER — Encounter (HOSPITAL_COMMUNITY): Payer: Self-pay | Admitting: Emergency Medicine

## 2015-02-10 DIAGNOSIS — Z9889 Other specified postprocedural states: Secondary | ICD-10-CM | POA: Insufficient documentation

## 2015-02-10 DIAGNOSIS — Z9861 Coronary angioplasty status: Secondary | ICD-10-CM | POA: Insufficient documentation

## 2015-02-10 DIAGNOSIS — F329 Major depressive disorder, single episode, unspecified: Secondary | ICD-10-CM | POA: Diagnosis not present

## 2015-02-10 DIAGNOSIS — Z792 Long term (current) use of antibiotics: Secondary | ICD-10-CM | POA: Insufficient documentation

## 2015-02-10 DIAGNOSIS — I2510 Atherosclerotic heart disease of native coronary artery without angina pectoris: Secondary | ICD-10-CM | POA: Insufficient documentation

## 2015-02-10 DIAGNOSIS — Z7982 Long term (current) use of aspirin: Secondary | ICD-10-CM | POA: Diagnosis not present

## 2015-02-10 DIAGNOSIS — E669 Obesity, unspecified: Secondary | ICD-10-CM | POA: Diagnosis not present

## 2015-02-10 DIAGNOSIS — J45901 Unspecified asthma with (acute) exacerbation: Secondary | ICD-10-CM | POA: Insufficient documentation

## 2015-02-10 DIAGNOSIS — Z720 Tobacco use: Secondary | ICD-10-CM | POA: Diagnosis not present

## 2015-02-10 DIAGNOSIS — E119 Type 2 diabetes mellitus without complications: Secondary | ICD-10-CM | POA: Insufficient documentation

## 2015-02-10 DIAGNOSIS — F419 Anxiety disorder, unspecified: Secondary | ICD-10-CM | POA: Diagnosis present

## 2015-02-10 DIAGNOSIS — I252 Old myocardial infarction: Secondary | ICD-10-CM | POA: Diagnosis not present

## 2015-02-10 DIAGNOSIS — Z79899 Other long term (current) drug therapy: Secondary | ICD-10-CM | POA: Insufficient documentation

## 2015-02-10 DIAGNOSIS — I10 Essential (primary) hypertension: Secondary | ICD-10-CM | POA: Diagnosis not present

## 2015-02-10 LAB — I-STAT CHEM 8, ED
BUN: 20 mg/dL (ref 6–23)
Calcium, Ion: 1.19 mmol/L (ref 1.12–1.23)
Chloride: 98 mmol/L (ref 96–112)
Creatinine, Ser: 0.9 mg/dL (ref 0.50–1.10)
Glucose, Bld: 223 mg/dL — ABNORMAL HIGH (ref 70–99)
HEMATOCRIT: 45 % (ref 36.0–46.0)
Hemoglobin: 15.3 g/dL — ABNORMAL HIGH (ref 12.0–15.0)
POTASSIUM: 4.2 mmol/L (ref 3.5–5.1)
SODIUM: 139 mmol/L (ref 135–145)
TCO2: 26 mmol/L (ref 0–100)

## 2015-02-10 LAB — I-STAT TROPONIN, ED: TROPONIN I, POC: 0.01 ng/mL (ref 0.00–0.08)

## 2015-02-10 MED ORDER — ALBUTEROL SULFATE HFA 108 (90 BASE) MCG/ACT IN AERS: 2.0000 | INHALATION_SPRAY | Freq: Once | RESPIRATORY_TRACT | Status: DC

## 2015-02-10 NOTE — ED Provider Notes (Signed)
CSN: [id number]     Arrival date & time 02/10/15  1200 History   None    Chief Complaint  Patient presents with  . Anxiety     (Consider location/radiation/quality/duration/timing/severity/associated sxs/prior Treatment) HPI Debbie [name] is a 54 y.o. female with history of anxiety, coronary artery disease, hypertension, diabetes comes in for evaluation of anxiety. Patient states she has been very stressed out for the past few months and that she has a lot of stress at home, recently lost 2 sisters the past away and is dealing with family issues at home. States her kids were running around this morning causing her to become anxious and stressed out and at approximately 10:00 she felt her "face tense up, I couldn't catch my breath in my left leg started twitching". She reports taking all her medications for the day at that point and laying down on the couch at which point her leg twitching stopped and her shortness of breath resolved. She reports getting back up at 11 but still was feeling anxious and that her face would tense up "every so often". She denies any chest pain, SOB, numbness or weakness, diaphoresis, nausea or vomiting, abdominal pain. She reports having a heart attack before but this did not feel the same. She denies any discomfort now as her symptoms have resolved in the ED spontaneously. No other aggravating or modifying factors. Reports she does have a history of asthma, but her inhaler ran out and that she has not had an attack in quite some time. Current 1 pack per day smoker.  Past Medical History  Diagnosis Date  . Diabetes mellitus   . Hypertension   . Depression   . Anxiety   . Asthma   . Coronary artery disease   . Obesity (BMI 30-39.9) 03/16/2013  . 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 Abdominal hysterectomy  2002  . Cervical laminectomy  2007  . Breast surgery      breast reduction 2002  .  Coronary stent placement    . Knee surgery    . Left leg fracture    . Left heart catheterization with coronary angiogram N/A 01/15/2012    Procedure: LEFT HEART CATHETERIZATION WITH CORONARY ANGIOGRAM;  Surgeon: Corky Crafts, MD;  Location: [facility] CATH LAB;  Service: Cardiovascular;  Laterality: N/A;  . Percutaneous coronary stent intervention (pci-s) N/A 01/15/2012    Procedure: PERCUTANEOUS CORONARY STENT INTERVENTION (PCI-S);  Surgeon: Corky Crafts, MD;  Location: [facility] CATH LAB;  Service: Cardiovascular;  Laterality: N/A;  . Left heart catheterization with coronary angiogram N/A 10/24/2012    Procedure: LEFT HEART CATHETERIZATION WITH CORONARY ANGIOGRAM;  Surgeon: Corky Crafts, MD;  Location: [facility] CATH LAB;  Service: Cardiovascular;  Laterality: N/A;   History reviewed. No pertinent family history. History  Substance Use Topics  . Smoking status: Current Every Day Smoker -- 1.00 packs/day for 30 years    Types: Cigarettes  . Smokeless tobacco: Not on file  . Alcohol Use: Yes     Comment: occ   OB History    No data available     Review of Systems A 10 point review of systems was completed and was negative except for pertinent positives and negatives as mentioned in the history of present illness     Allergies  Review of patient's allergies indicates no known allergies.  Home Medications   Prior to Admission medications   Medication Sig Start  Date End Date Taking? Authorizing Provider  aspirin 81 MG chewable tablet Chew 81 mg by mouth daily.   Yes Historical Provider, MD  citalopram (CELEXA) 20 MG tablet Take 20 mg by mouth at bedtime.   Yes Historical Provider, MD  glipiZIDE-metformin (METAGLIP) 5-500 MG per tablet Take 2 tablets by mouth 2 (two) times daily before a meal. 03/17/13  Yes Corky CraftsJayadeep S Varanasi, MD  Ibuprofen-Famotidine 800-26.6 MG TABS Take 1 tablet by mouth 3 (three) times daily. 02/04/15  Yes Teressa LowerVrinda Pickering, NP  lisinopril (PRINIVIL,ZESTRIL) 10 MG  tablet Take 10 mg by mouth daily.   Yes Historical Provider, MD  metoprolol tartrate (LOPRESSOR) 25 MG tablet Take 1 tablet (25 mg total) by mouth 2 (two) times daily. 03/17/13  Yes Corky CraftsJayadeep S Varanasi, MD  nitroGLYCERIN (NITROSTAT) 0.4 MG SL tablet Place 0.4 mg under the tongue every 5 (five) minutes x 2 doses as needed. For chest pain   Yes Historical Provider, MD  sertraline (ZOLOFT) 50 MG tablet Take 50 mg by mouth daily.   Yes Historical Provider, MD  traZODone (DESYREL) 100 MG tablet Take 100 mg by mouth at bedtime.   Yes Historical Provider, MD  clindamycin (CLEOCIN) 300 MG capsule Take 1 capsule (300 mg total) by mouth 4 (four) times daily. X 7 days Patient not taking: Reported on 11/25/2014 05/04/14   Loren Raceravid Yelverton, MD  doxycycline (VIBRAMYCIN) 100 MG capsule Take 1 capsule (100 mg total) by mouth 2 (two) times daily. 12/30/14   Fayrene HelperBowie Tran, PA-C  metroNIDAZOLE (FLAGYL) 500 MG tablet Take 1 tablet (500 mg total) by mouth 2 (two) times daily. 12/30/14   Fayrene HelperBowie Tran, PA-C  Multiple Vitamins-Minerals (MULTIVITAMIN WITH MINERALS) tablet Take 1 tablet by mouth daily.    Historical Provider, MD  nystatin (MYCOSTATIN/NYSTOP) 100000 UNIT/GM POWD Apply twice a day to the groin area. Patient not taking: Reported on 11/25/2014 05/04/14   Loren Raceravid Yelverton, MD   BP 155/90 mmHg  Pulse 63  Temp(Src) 98.6 F (37 C) (Oral)  Resp 18  SpO2 94% Physical Exam  Constitutional: She is oriented to person, place, and time. She appears well-developed and well-nourished.  HENT:  Head: Normocephalic and atraumatic.  Mouth/Throat: Oropharynx is clear and moist.  Eyes: Conjunctivae are normal. Pupils are equal, round, and reactive to light. Right eye exhibits no discharge. Left eye exhibits no discharge. No scleral icterus.  Neck: Neck supple.  Cardiovascular: Normal rate, regular rhythm and normal heart sounds.   Pulmonary/Chest: Effort normal and breath sounds normal. No respiratory distress. She has no rales.   Diffuse wheezing in all lung fields without rales or rhonchi or other adventitious lung sounds. Chest expansion rises appropriately with respiration. No evidence of respiratory distress.  Abdominal: Soft. She exhibits no distension and no mass. There is no tenderness. There is no rebound and no guarding.  Musculoskeletal: She exhibits no tenderness.  Neurological: She is alert and oriented to person, place, and time.  Cranial Nerves II-XII grossly intact. Motor and sensation grossly intact, however, left lower extremity is slightly weaker with extension than the right. Patient states this is baseline for the past 8 years and this is not new. Gait is baseline without ataxia. Completes finger to nose and heel to shin coordination movements without difficulty.  Skin: Skin is warm and dry. No rash noted.  Psychiatric: She has a normal mood and affect.  Nursing note and vitals reviewed.   ED Course  Procedures (including critical care time) Labs Review Labs Reviewed  I-STAT CHEM  8, ED - Abnormal; Notable for the following:    Glucose, Bld 223 (*)    Hemoglobin 15.3 (*)    All other components within normal limits  I-STAT TROPOININ, ED    Imaging Review Dg Chest 2 View  02/10/2015   CLINICAL DATA:  54 year old female with chest pain, anxiety and clinical history of panic attacks  EXAM: CHEST  2 VIEW  COMPARISON:  Prior chest x-ray 03/16/2013  FINDINGS: The lungs are clear and negative for focal airspace consolidation, pulmonary edema or suspicious pulmonary nodule. No pleural effusion or pneumothorax. Cardiac and mediastinal contours are within normal limits. Metallic stent projects over the left heart. No acute fracture or lytic or blastic osseous lesions. Interval anterior cervical discectomy and fusion compared to prior imaging. The surgical changes are incompletely imaged. The visualized upper abdominal bowel gas pattern is unremarkable.  IMPRESSION: Negative chest x-ray.   Electronically  Signed   By: Malachy Moan M.D.   On: 02/10/2015 16:10     EKG Interpretation   Date/Time:  Thursday February 10 2015 12:57:29 EDT Ventricular Rate:  76 PR Interval:  149 QRS Duration: 71 QT Interval:  404 QTC Calculation: 454 R Axis:   78 Text Interpretation:  Sinus rhythm Probable anterolateral infarct, age  indeterm Sinus rhythm T wave abnormality No significant change since last  tracing Abnormal ekg Confirmed by Gerhard Munch  MD [phone]) on 02/10/2015  3:37:51 PM     Meds given in ED:  Medications - No data to display  Discharge Medication List as of 02/10/2015  5:49 PM     . Ceasar Mons Vitals:   02/10/15 1237 02/10/15 1239 02/10/15 1818  BP: 130/71  155/90  Pulse: 78  63  Temp:  98.1 F (36.7 C) 98.6 F (37 C)  TempSrc:  Oral Oral  Resp: SpO2: 94%  94%     MDM  Vitals stable - WNL -afebrile Pt resting comfortably in ED. PE--benign neuro exam. Gait baseline. Diffuse wheezing on auscultation, patient states she is breathing at baseline with no new complaints. EKG reassuring and essentially unchanged from previous. Labs otherwise noncontributory. Troponin negative.  DDX--patient likely suffering from anxiety secondary to stressors at home. Discussed need for follow-up with PCP for further evaluation of anxiety. Low concern for other acute or emergent pathology at this time. No evidence of CVA/SAH/ICH or other vascular compromise. Clinical picture and physical exam not consistent with ACS. Will DC with albuterol inhaler to assist with intermittent shortness of breath.patient states she has new primary care and will be able to follow-up with him this week for further evaluation and management of her symptoms. I discussed all relevant lab findings and imaging results with pt and they verbalized understanding. Discussed f/u with PCP within 48 hrs and return precautions, pt very amenable to plan. Prior to patient discharge, I discussed and reviewed this case with  Dr.Lockwood    Final diagnoses:  Anxiety        Joycie Peek, PA-C 02/11/15 0122  Gerhard Munch, MD 02/13/15 [phone]

## 2015-02-10 NOTE — ED Notes (Signed)
Questions, concerns denied r/t dc . Pt ambulatory and a&ox4 upon dc

## 2015-02-10 NOTE — ED Notes (Signed)
Pt presents via EMS from home with c/o anxiety and panic attack. Pt reports she has anxiety but no reported hx of panic attacks in the past. Pt is alert and oriented, calm at this time.

## 2015-02-10 NOTE — ED Notes (Signed)
PA at bedside.

## 2015-02-10 NOTE — ED Notes (Signed)
Pt c/o anxiety onset today, states her left leg was "jumping," states she has been experiencing a lot of stress.

## 2015-02-10 NOTE — Discharge Instructions (Signed)
Panic Attacks Panic attacks are sudden, short feelings of great fear or discomfort. You may have them for no reason when you are relaxed, when you are uneasy (anxious), or when you are sleeping.  HOME CARE  Take all your medicines as told.  Check with your doctor before starting new medicines.  Keep all doctor visits. GET HELP IF:  You are not able to take your medicines as told.  Your symptoms do not get better.  Your symptoms get worse. GET HELP RIGHT AWAY IF:  Your attacks seem different than your normal attacks.  You have thoughts about hurting yourself or others.  You take panic attack medicine and you have a side effect. MAKE SURE YOU:  Understand these instructions.  Will watch your condition.  Will get help right away if you are not doing well or get worse. Document Released: 12/08/2010 Document Revised: 08/26/2013 Document Reviewed: 06/19/2013 [facility] Patient Information 2015 [employer] [place]. This information is not intended to replace advice given to you by your health care provider. Make sure you discuss any questions you have with your health care provider.  Your evaluation in the ED today did not show an emergent cause for your symptoms at this time. It is important to follow up with her primary care for further evaluation and management of your symptoms. Please use her albuterol inhaler as needed for any shortness of breath may experience. It is also important for you to consider stopping smoking as this is detrimental to your health. Return to ED for new or worsening symptoms.

## 2015-05-13 ENCOUNTER — Emergency Department (HOSPITAL_COMMUNITY): Payer: Commercial Managed Care - HMO

## 2015-05-13 ENCOUNTER — Encounter (HOSPITAL_COMMUNITY): Payer: Self-pay | Admitting: Emergency Medicine

## 2015-05-13 ENCOUNTER — Encounter (HOSPITAL_COMMUNITY): Admission: EM | Disposition: E | Payer: Self-pay | Source: Home / Self Care | Attending: Emergency Medicine

## 2015-05-13 ENCOUNTER — Emergency Department (HOSPITAL_COMMUNITY)
Admission: EM | Admit: 2015-05-13 | Discharge: 2015-05-20 | Disposition: E | Payer: Commercial Managed Care - HMO | Attending: Emergency Medicine | Admitting: Emergency Medicine

## 2015-05-13 ENCOUNTER — Ambulatory Visit (HOSPITAL_COMMUNITY): Admit: 2015-05-13 | Payer: Medicare Other | Admitting: Interventional Cardiology

## 2015-05-13 DIAGNOSIS — Z6830 Body mass index (BMI) 30.0-30.9, adult: Secondary | ICD-10-CM | POA: Insufficient documentation

## 2015-05-13 DIAGNOSIS — I2101 ST elevation (STEMI) myocardial infarction involving left main coronary artery: Secondary | ICD-10-CM

## 2015-05-13 DIAGNOSIS — F17210 Nicotine dependence, cigarettes, uncomplicated: Secondary | ICD-10-CM | POA: Insufficient documentation

## 2015-05-13 DIAGNOSIS — E669 Obesity, unspecified: Secondary | ICD-10-CM | POA: Insufficient documentation

## 2015-05-13 DIAGNOSIS — R001 Bradycardia, unspecified: Secondary | ICD-10-CM | POA: Diagnosis present

## 2015-05-13 DIAGNOSIS — Z7982 Long term (current) use of aspirin: Secondary | ICD-10-CM | POA: Insufficient documentation

## 2015-05-13 DIAGNOSIS — R570 Cardiogenic shock: Secondary | ICD-10-CM

## 2015-05-13 DIAGNOSIS — I469 Cardiac arrest, cause unspecified: Secondary | ICD-10-CM | POA: Insufficient documentation

## 2015-05-13 DIAGNOSIS — Z955 Presence of coronary angioplasty implant and graft: Secondary | ICD-10-CM | POA: Insufficient documentation

## 2015-05-13 DIAGNOSIS — F329 Major depressive disorder, single episode, unspecified: Secondary | ICD-10-CM | POA: Insufficient documentation

## 2015-05-13 DIAGNOSIS — I10 Essential (primary) hypertension: Secondary | ICD-10-CM | POA: Insufficient documentation

## 2015-05-13 DIAGNOSIS — I213 ST elevation (STEMI) myocardial infarction of unspecified site: Secondary | ICD-10-CM

## 2015-05-13 DIAGNOSIS — Z792 Long term (current) use of antibiotics: Secondary | ICD-10-CM | POA: Insufficient documentation

## 2015-05-13 DIAGNOSIS — J45909 Unspecified asthma, uncomplicated: Secondary | ICD-10-CM | POA: Insufficient documentation

## 2015-05-13 DIAGNOSIS — I2510 Atherosclerotic heart disease of native coronary artery without angina pectoris: Secondary | ICD-10-CM | POA: Diagnosis present

## 2015-05-13 DIAGNOSIS — E119 Type 2 diabetes mellitus without complications: Secondary | ICD-10-CM | POA: Insufficient documentation

## 2015-05-13 DIAGNOSIS — I442 Atrioventricular block, complete: Secondary | ICD-10-CM | POA: Insufficient documentation

## 2015-05-13 DIAGNOSIS — I4510 Unspecified right bundle-branch block: Secondary | ICD-10-CM | POA: Insufficient documentation

## 2015-05-13 DIAGNOSIS — F419 Anxiety disorder, unspecified: Secondary | ICD-10-CM | POA: Insufficient documentation

## 2015-05-13 DIAGNOSIS — E872 Acidosis, unspecified: Secondary | ICD-10-CM

## 2015-05-13 DIAGNOSIS — I252 Old myocardial infarction: Secondary | ICD-10-CM | POA: Insufficient documentation

## 2015-05-13 DIAGNOSIS — Z79899 Other long term (current) drug therapy: Secondary | ICD-10-CM | POA: Insufficient documentation

## 2015-05-13 HISTORY — PX: CARDIAC CATHETERIZATION: SHX172

## 2015-05-13 LAB — I-STAT CHEM 8, ED
BUN: 19 mg/dL (ref 6–20)
CALCIUM ION: 0.98 mmol/L — AB (ref 1.12–1.23)
CHLORIDE: 99 mmol/L — AB (ref 101–111)
Creatinine, Ser: 1.2 mg/dL — ABNORMAL HIGH (ref 0.44–1.00)
Glucose, Bld: 664 mg/dL (ref 65–99)
HCT: 43 % (ref 36.0–46.0)
Hemoglobin: 14.6 g/dL (ref 12.0–15.0)
Potassium: 3.2 mmol/L — ABNORMAL LOW (ref 3.5–5.1)
Sodium: 135 mmol/L (ref 135–145)
TCO2: 14 mmol/L (ref 0–100)

## 2015-05-13 SURGERY — LEFT HEART CATH AND CORONARY ANGIOGRAPHY
Anesthesia: LOCAL

## 2015-05-13 MED ORDER — ONDANSETRON HCL 4 MG/2ML IJ SOLN
INTRAMUSCULAR | Status: AC
  Filled 2015-05-13: qty 2

## 2015-05-13 MED ORDER — DEXTROSE 5 % IV SOLN
150.0000 mg | INTRAVENOUS | Status: AC | PRN
  Administered 2015-05-13: 150 mg via INTRAVENOUS

## 2015-05-13 MED ORDER — HEPARIN SODIUM (PORCINE) 5000 UNIT/ML IJ SOLN
4000.0000 [IU] | INTRAMUSCULAR | Status: AC
  Administered 2015-05-13: 4000 [IU] via INTRAVENOUS
  Filled 2015-05-13: qty 0.8

## 2015-05-13 MED ORDER — MIDAZOLAM HCL 2 MG/2ML IJ SOLN
INTRAMUSCULAR | Status: DC
Start: 2015-05-13 — End: 2015-05-14
  Filled 2015-05-13: qty 4

## 2015-05-13 MED ORDER — EPINEPHRINE HCL 0.1 MG/ML IJ SOSY
PREFILLED_SYRINGE | INTRAMUSCULAR | Status: AC | PRN
  Administered 2015-05-13 (×3): 1 mg via INTRAVENOUS

## 2015-05-13 MED ORDER — DOPAMINE-DEXTROSE 3.2-5 MG/ML-% IV SOLN
INTRAVENOUS | Status: AC | PRN
  Administered 2015-05-13: 20 ug/kg/min via INTRAVENOUS

## 2015-05-13 MED ORDER — DEXTROSE 5 % IV SOLN
0.5000 ug/min | INTRAVENOUS | Status: DC
  Administered 2015-05-13: 2 ug/min via INTRAVENOUS
  Filled 2015-05-13: qty 4

## 2015-05-13 MED ORDER — SODIUM CHLORIDE 0.9 % IV SOLN: INTRAVENOUS | Status: DC

## 2015-05-13 MED ORDER — FENTANYL CITRATE (PF) 100 MCG/2ML IJ SOLN
INTRAMUSCULAR | Status: AC
  Filled 2015-05-13: qty 2

## 2015-05-13 MED ORDER — ASPIRIN 300 MG RE SUPP
300.0000 mg | Freq: Once | RECTAL | Status: AC
  Administered 2015-05-13: 300 mg via RECTAL

## 2015-05-13 MED ORDER — SODIUM BICARBONATE 8.4 % IV SOLN
INTRAVENOUS | Status: AC | PRN
  Administered 2015-05-13: 50 meq via INTRAVENOUS

## 2015-05-13 MED ORDER — ASPIRIN 81 MG PO CHEW: 324.0000 mg | CHEWABLE_TABLET | Freq: Once | ORAL | Status: DC

## 2015-05-13 SURGICAL SUPPLY — 20 items
BALLN EUPHORA RX 3.0X12 (BALLOONS) ×3
BALLN LINEAR 7.5FR IABP 40CC (BALLOONS) ×3
BALLOON EUPHORA RX 3.0X12 (BALLOONS) ×2 IMPLANT
BALLOON LINEAR 7.5FR IABP 40CC (BALLOONS) ×2 IMPLANT
CATH EXTRAC PRONTO LP 6F RND (CATHETERS) ×3 IMPLANT
CATH INFINITI 5FR JL4 (CATHETERS) ×3 IMPLANT
CATH INFINITI JR4 5F (CATHETERS) ×3 IMPLANT
CATH S G BIP PACING (SET/KITS/TRAYS/PACK) ×3 IMPLANT
CATH VISTA GUIDE 6FR XBLAD3.5 (CATHETERS) ×3 IMPLANT
HOVERMATT SINGLE USE (MISCELLANEOUS) ×3 IMPLANT
KIT ENCORE 26 ADVANTAGE (KITS) ×3 IMPLANT
KIT HEART LEFT (KITS) ×3 IMPLANT
PACK CARDIAC CATHETERIZATION (CUSTOM PROCEDURE TRAY) ×3 IMPLANT
SHEATH PINNACLE 5F 10CM (SHEATH) ×3 IMPLANT
SHEATH PINNACLE 6F 10CM (SHEATH) ×6 IMPLANT
SLEEVE REPOSITIONING LENGTH 30 (MISCELLANEOUS) ×3 IMPLANT
TRANSDUCER W/STOPCOCK (MISCELLANEOUS) ×3 IMPLANT
TUBING CIL FLEX 10 FLL-RA (TUBING) ×3 IMPLANT
WIRE EMERALD 3MM-J .035X150CM (WIRE) ×3 IMPLANT
WIRE HI TORQ BMW 190CM (WIRE) ×6 IMPLANT

## 2015-05-13 NOTE — ED Provider Notes (Signed)
CSN: [id number]     Arrival date & time 05/25/2015  2325 History  This chart was scribed for Azalia Bilis, MD by Merlene Laughter, ED Scribe. This patient was seen in room [facility] and the patient's care was started at 11:26 PM.   Chief Complaint  Patient presents with  . Code STEMI   The history is provided by the EMS personnel. No language interpreter was used.   Level V caveat: acuity of illness  HPI Comments: Debbie [name] is a 54 y.o. female brought in by EMS, with a PMHx of diabetes, hypertension, asthma, CAD and MI 4-5 years ago with ostial LAD stent placement, who presents to the Emergency Department complaining chest pain. Brought to ER as code STEMI. Per EMS, patient has had episode of LOC and has had intermittent altering levels of consciousness. Pt was shocked twice for VT. Pt reports associated nausea. She currently denies associated chest pain, SOB or back pain. She states she drank a beer and smoked marijuana earlier today. She denies use of cocaine.   Past Medical History  Diagnosis Date  . Diabetes mellitus   . Hypertension   . Depression   . Anxiety   . Asthma   . Coronary artery disease   . Obesity (BMI 30-39.9) 03/16/2013  . 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 Abdominal hysterectomy  2002  . Cervical laminectomy  2007  . Breast surgery      breast reduction 2002  . Coronary stent placement    . Knee surgery    . Left leg fracture    . Left heart catheterization with coronary angiogram N/A 01/15/2012    Procedure: LEFT HEART CATHETERIZATION WITH CORONARY ANGIOGRAM;  Surgeon: Corky Crafts, MD;  Location: [facility] CATH LAB;  Service: Cardiovascular;  Laterality: N/A;  . Percutaneous coronary stent intervention (pci-s) N/A 01/15/2012    Procedure: PERCUTANEOUS CORONARY STENT INTERVENTION (PCI-S);  Surgeon: Corky Crafts, MD;  Location: [facility] CATH LAB;  Service: Cardiovascular;  Laterality: N/A;   . Left heart catheterization with coronary angiogram N/A 10/24/2012    Procedure: LEFT HEART CATHETERIZATION WITH CORONARY ANGIOGRAM;  Surgeon: Corky Crafts, MD;  Location: [facility] CATH LAB;  Service: Cardiovascular;  Laterality: N/A;   No family history on file. History  Substance Use Topics  . Smoking status: Current Every Day Smoker -- 1.00 packs/day for 30 years    Types: Cigarettes  . Smokeless tobacco: Not on file  . Alcohol Use: Yes     Comment: occ   OB History    No data available     Review of Systems  Unable to perform ROS   A complete 10 system review of systems was obtained and all systems are negative except as noted in the HPI and PMH.   Allergies  Review of patient's allergies indicates no known allergies.  Home Medications   Prior to Admission medications   Medication Sig Start Date End Date Taking? Authorizing Provider  aspirin 81 MG chewable tablet Chew 81 mg by mouth daily.    Historical Provider, MD  citalopram (CELEXA) 20 MG tablet Take 20 mg by mouth at bedtime.    Historical Provider, MD  clindamycin (CLEOCIN) 300 MG capsule Take 1 capsule (300 mg total) by mouth 4 (four) times daily. X 7 days Patient not taking: Reported on 11/25/2014 05/04/14   Loren Racer, MD  doxycycline (VIBRAMYCIN) 100 MG capsule Take 1 capsule (100 mg total)  by mouth 2 (two) times daily. 12/30/14   Fayrene Helper, PA-C  glipiZIDE-metformin (METAGLIP) 5-500 MG per tablet Take 2 tablets by mouth 2 (two) times daily before a meal. 03/17/13   Corky Crafts, MD  Ibuprofen-Famotidine 800-26.6 MG TABS Take 1 tablet by mouth 3 (three) times daily. 02/04/15   Teressa Lower, NP  lisinopril (PRINIVIL,ZESTRIL) 10 MG tablet Take 10 mg by mouth daily.    Historical Provider, MD  metoprolol tartrate (LOPRESSOR) 25 MG tablet Take 1 tablet (25 mg total) by mouth 2 (two) times daily. 03/17/13   Corky Crafts, MD  metroNIDAZOLE (FLAGYL) 500 MG tablet Take 1 tablet (500 mg total) by mouth 2  (two) times daily. 12/30/14   Fayrene Helper, PA-C  Multiple Vitamins-Minerals (MULTIVITAMIN WITH MINERALS) tablet Take 1 tablet by mouth daily.    Historical Provider, MD  nitroGLYCERIN (NITROSTAT) 0.4 MG SL tablet Place 0.4 mg under the tongue every 5 (five) minutes x 2 doses as needed. For chest pain    Historical Provider, MD  nystatin (MYCOSTATIN/NYSTOP) 100000 UNIT/GM POWD Apply twice a day to the groin area. Patient not taking: Reported on 11/25/2014 05/04/14   Loren Racer, MD  sertraline (ZOLOFT) 50 MG tablet Take 50 mg by mouth daily.    Historical Provider, MD  traZODone (DESYREL) 100 MG tablet Take 100 mg by mouth at bedtime.    Historical Provider, MD   Triage Vitals: BP 128/72 mmHg  Pulse 28  Resp 25  SpO2 46%  Physical Exam  Constitutional: She is oriented to person, place, and time. She appears well-developed. She appears distressed.  Ill-appearing  HENT:  Head: Normocephalic and atraumatic.  Eyes: EOM are normal.  Neck: Normal range of motion.  Cardiovascular: Normal rate and regular rhythm.   Pulmonary/Chest: Effort normal and breath sounds normal.  Abdominal: Soft. She exhibits no distension. There is no tenderness.  Musculoskeletal: Normal range of motion.  Neurological: She is alert and oriented to person, place, and time.  Skin: She is diaphoretic.  Pale, clammy, diaphoretic  Psychiatric: She has a normal mood and affect. Judgment normal.  Nursing note and vitals reviewed.   ED Course  Procedures (including critical care time)  CRITICAL CARE Performed by: Lyanne Co Total critical care time: 45 Critical care time was exclusive of separately billable procedures and treating other patients. Critical care was necessary to treat or prevent imminent or life-threatening deterioration. Critical care was time spent personally by me on the following activities: development of treatment plan with patient and/or surrogate as well as nursing, discussions with  consultants, evaluation of patient's response to treatment, examination of patient, obtaining history from patient or surrogate, ordering and performing treatments and interventions, ordering and review of laboratory studies, ordering and review of radiographic studies, pulse oximetry and re-evaluation of patient's condition.  Cardiopulmonary Resuscitation (CPR) Procedure Note Directed/Performed by: Lyanne Co I personally directed ancillary staff and/or performed CPR in an effort to regain return of spontaneous circulation and to maintain cardiac, neuro and systemic perfusion.   INTUBATION Performed by: Lyanne Co Required items: required blood products, implants, devices, and special equipment available Patient identity confirmed: provided demographic data and hospital-assigned identification number Time out: Immediately prior to procedure a "time out" was called to verify the correct patient, procedure, equipment, support staff and site/side marked as required. Indications: cardiac arrest/STEMI/shock Intubation method: Glidescope Laryngoscopy  Preoxygenation: BVM Sedatives: Etomidate Paralytic: Succinylcholine Tube Size: 7.5 cuffed Post-procedure assessment: chest rise and ETCO2 monitor Breath sounds: equal and absent over  the epigastrium Tube secured with: ETT holder Chest x-ray interpreted by radiologist and me. Chest x-ray findings: endotracheal tube in appropriate position Patient tolerated the procedure well with no immediate complications.     DIAGNOSTIC STUDIES: Oxygen Saturation is 46% on [place], low by my interpretation.    COORDINATION OF CARE: 11:49 PM- Intubated patient. Will order diagnostic CXR, EKG and lab work. Will give medications. Discussed plans to intubate patient and send pt to cardiac cath lab. Conducted CPR. Pt advised of plan for treatment and pt agrees.   11:53 PM - CPR reinitiated. No pulse.  Labs Review Labs Reviewed  CBC - Abnormal; Notable for  the following:    Platelets 94 (*)    All other components within normal limits  COMPREHENSIVE METABOLIC PANEL - Abnormal; Notable for the following:    Sodium 133 (*)    Chloride 99 (*)    CO2 13 (*)    Glucose, Bld 672 (*)    Creatinine, Ser 1.12 (*)    Calcium 7.3 (*)    Total Protein 5.0 (*)    Albumin 2.8 (*)    AST 614 (*)    ALT 457 (*)    GFR calc non Af Amer 55 (*)    Anion gap 21 (*)    All other components within normal limits  I-STAT TROPOININ, ED - Abnormal; Notable for the following:    Troponin i, poc 0.11 (*)    All other components within normal limits  I-STAT CHEM 8, ED - Abnormal; Notable for the following:    Potassium 3.2 (*)    Chloride 99 (*)    Creatinine, Ser 1.20 (*)    Glucose, Bld 664 (*)    Calcium, Ion 0.98 (*)    All other components within normal limits  PROTIME-INR  APTT  I-STAT TROPOININ, ED  I-STAT TROPOININ, ED  I-STAT TROPOININ, ED   Imaging Review Dg Chest Portable 1 View  04/20/2015   CLINICAL DATA:  Code STEMI  EXAM: PORTABLE CHEST - 1 VIEW  COMPARISON:  02/06/1939 thin 16  FINDINGS: Endotracheal tube terminates 5 cm above the carina.  Lungs are clear.  No pleural effusion or pneumothorax.  Cardiomegaly.  Enteric tube courses below the diaphragm.  Defibrillator pads overlying the left hemithorax.  IMPRESSION: Endotracheal tube terminates 5 cm above the carina.  No evidence of acute cardiopulmonary disease.   Electronically Signed   By: Charline Bills M.D.   On: 05/05/2015 00:08  I personally reviewed the imaging tests through PACS system I reviewed available ER/hospitalization records through the EMR    EKG Interpretation   Date/Time:  Friday June 05, 2015 23:31:23 EDT Ventricular Rate:  132 PR Interval:  64 QRS Duration: 183 QT Interval:  352 QTC Calculation: 522 R Axis:   -79 Text Interpretation:  Sinus tachycardia IVCD, consider atypical RBBB  Anterolateral infarct, acute (LAD) Baseline wander in lead(s) II III aVF  **  ** ACUTE MI / STEMI ** ** Confirmed by Siana Panameno  MD, Mahalia Dykes ([id number]) on  05/02/2015 1:41:37 AM      MDM   Final diagnoses:  ST elevation myocardial infarction (STEMI), unspecified artery  Cardiogenic shock  Acidosis     Patient is brought to the emergency department in cardiogenic shock.  Obvious anterior ST elevation MI and a history of coronary artery disease.  On arrival IV fluids was initiated and as she became bradycardic CPR was initiated.  She required standard ACLS medications and standard ACLS care.  CPR  was performed.  I was assisted during the resuscitation with both the cardiology fellow and the interventional cardiologist Dr. Garnette Scheuermann.  Patient was given epinephrine and started on dopamine.  She went into ventricular tachycardia and was defibrillated twice.  She was started on amiodarone.  She obviously required intubation early in her resuscitation for airway protection and shock.  We're able to improve her blood pressure however she would become hypotensive again.  She required intermittent episodes of CPR.  She was able to be stabilized close enough to the point were we felt as though the only possible course to save her life would be resuscitation and aortic balloon pump in the Cath Lab.  She was taken emergently after maximal stabilization in the emergency department to the Cath Lab.   I personally performed the services described in this documentation, which was scribed in my presence. The recorded information has been reviewed and is accurate.      Azalia Bilis, MD 05/18/2015 [phone]

## 2015-05-13 NOTE — ED Notes (Signed)
Code stemi called in the field, hypotensive. Pt is from home, pt c/o nausea to family, family reports pt passed out. Hx of diabetes. Intermittently responsive with EMS.

## 2015-05-14 ENCOUNTER — Encounter (HOSPITAL_COMMUNITY): Payer: Self-pay | Admitting: Emergency Medicine

## 2015-05-14 DIAGNOSIS — I469 Cardiac arrest, cause unspecified: Secondary | ICD-10-CM

## 2015-05-14 DIAGNOSIS — I2109 ST elevation (STEMI) myocardial infarction involving other coronary artery of anterior wall: Secondary | ICD-10-CM | POA: Diagnosis not present

## 2015-05-14 DIAGNOSIS — R570 Cardiogenic shock: Secondary | ICD-10-CM

## 2015-05-14 DIAGNOSIS — I2510 Atherosclerotic heart disease of native coronary artery without angina pectoris: Secondary | ICD-10-CM | POA: Diagnosis not present

## 2015-05-14 DIAGNOSIS — I472 Ventricular tachycardia: Secondary | ICD-10-CM | POA: Diagnosis not present

## 2015-05-14 DIAGNOSIS — I2101 ST elevation (STEMI) myocardial infarction involving left main coronary artery: Secondary | ICD-10-CM | POA: Diagnosis not present

## 2015-05-14 DIAGNOSIS — R001 Bradycardia, unspecified: Secondary | ICD-10-CM | POA: Diagnosis present

## 2015-05-14 LAB — COMPREHENSIVE METABOLIC PANEL
ALBUMIN: 2.8 g/dL — AB (ref 3.5–5.0)
ALT: 457 U/L — AB (ref 14–54)
AST: 614 U/L — ABNORMAL HIGH (ref 15–41)
Alkaline Phosphatase: 56 U/L (ref 38–126)
Anion gap: 21 — ABNORMAL HIGH (ref 5–15)
BUN: 14 mg/dL (ref 6–20)
CALCIUM: 7.3 mg/dL — AB (ref 8.9–10.3)
CHLORIDE: 99 mmol/L — AB (ref 101–111)
CO2: 13 mmol/L — ABNORMAL LOW (ref 22–32)
CREATININE: 1.12 mg/dL — AB (ref 0.44–1.00)
GFR calc non Af Amer: 55 mL/min — ABNORMAL LOW (ref >60)
Glucose, Bld: 672 mg/dL (ref 65–99)
Potassium: 3.8 mmol/L (ref 3.5–5.1)
Sodium: 133 mmol/L — ABNORMAL LOW (ref 135–145)
Total Bilirubin: 0.9 mg/dL (ref 0.3–1.2)
Total Protein: 5 g/dL — ABNORMAL LOW (ref 6.5–8.1)

## 2015-05-14 LAB — CBC
HCT: 42.9 % (ref 36.0–46.0)
HEMOGLOBIN: 14.4 g/dL (ref 12.0–15.0)
MCH: 32.8 pg (ref 26.0–34.0)
MCHC: 33.6 g/dL (ref 30.0–36.0)
MCV: 97.7 fL (ref 78.0–100.0)
Platelets: 94 10*3/uL — ABNORMAL LOW (ref 150–400)
RBC: 4.39 MIL/uL (ref 3.87–5.11)
RDW: 13.1 % (ref 11.5–15.5)
WBC: 7.1 10*3/uL (ref 4.0–10.5)

## 2015-05-14 LAB — I-STAT TROPONIN, ED: Troponin i, poc: 0.11 ng/mL (ref 0.00–0.08)

## 2015-05-14 MED ORDER — IOHEXOL 350 MG/ML SOLN
INTRAVENOUS | Status: DC | PRN
  Administered 2015-05-14: 125 mL via INTRAVENOUS

## 2015-05-14 MED ORDER — STERILE WATER FOR INJECTION IV SOLN
INTRAVENOUS | Status: DC
  Filled 2015-05-14 (×3): qty 850

## 2015-05-14 MED ORDER — SODIUM CHLORIDE 0.9 % IV SOLN
INTRAVENOUS | Status: DC | PRN
  Administered 2015-05-13: 1000 mL via INTRAVENOUS

## 2015-05-14 MED ORDER — NITROGLYCERIN 1 MG/10 ML FOR IR/CATH LAB
INTRA_ARTERIAL | Status: AC
  Filled 2015-05-14: qty 10

## 2015-05-14 MED ORDER — AMIODARONE HCL 150 MG/3ML IV SOLN
INTRAVENOUS | Status: AC
  Filled 2015-05-14: qty 3

## 2015-05-14 MED ORDER — FENTANYL BOLUS VIA INFUSION
50.0000 ug | INTRAVENOUS | Status: DC | PRN
  Filled 2015-05-14: qty 50

## 2015-05-14 MED ORDER — LIDOCAINE HCL (PF) 1 % IJ SOLN
INTRAMUSCULAR | Status: DC | PRN
  Administered 2015-05-14: 30 mL

## 2015-05-14 MED ORDER — EPINEPHRINE HCL 1 MG/ML IJ SOLN
INTRAMUSCULAR | Status: AC
  Filled 2015-05-14: qty 1

## 2015-05-14 MED ORDER — HEPARIN (PORCINE) IN NACL 2-0.9 UNIT/ML-% IJ SOLN
INTRAMUSCULAR | Status: AC
  Filled 2015-05-14: qty 1000

## 2015-05-14 MED ORDER — SODIUM BICARBONATE 8.4 % IV SOLN
INTRAVENOUS | Status: AC
  Filled 2015-05-14: qty 50

## 2015-05-14 MED ORDER — CALCIUM CHLORIDE 10 % IV SOLN
INTRAVENOUS | Status: DC | PRN
  Administered 2015-05-13: 1 g via INTRAVENOUS

## 2015-05-14 MED ORDER — MIDAZOLAM BOLUS VIA INFUSION
1.0000 mg | INTRAVENOUS | Status: DC | PRN
  Filled 2015-05-14: qty 2

## 2015-05-14 MED ORDER — DOPAMINE-DEXTROSE 3.2-5 MG/ML-% IV SOLN
INTRAVENOUS | Status: DC | PRN
  Administered 2015-05-14: 10 ug/kg/min via INTRAVENOUS

## 2015-05-14 MED ORDER — SODIUM BICARBONATE 8.4 % IV SOLN
INTRAVENOUS | Status: DC | PRN
  Administered 2015-05-14 (×2): 100 meq via INTRAVENOUS
  Administered 2015-05-14 (×3): 50 meq via INTRAVENOUS
  Administered 2015-05-14: 25 meq via INTRAVENOUS
  Administered 2015-05-14: 50 meq via INTRAVENOUS

## 2015-05-14 MED ORDER — SODIUM CHLORIDE 0.9 % IV SOLN
0.0000 mg/h | INTRAVENOUS | Status: DC
  Filled 2015-05-14: qty 10

## 2015-05-14 MED ORDER — ONDANSETRON HCL 4 MG/2ML IJ SOLN
4.0000 mg | Freq: Once | INTRAMUSCULAR | Status: AC
  Administered 2015-05-13: 4 mg via INTRAVENOUS

## 2015-05-14 MED ORDER — EPINEPHRINE HCL 0.1 MG/ML IJ SOSY
PREFILLED_SYRINGE | INTRAMUSCULAR | Status: AC
  Filled 2015-05-14: qty 10

## 2015-05-14 MED ORDER — SUCCINYLCHOLINE CHLORIDE 20 MG/ML IJ SOLN
INTRAMUSCULAR | Status: DC | PRN
  Administered 2015-05-13: 100 mg via INTRAVENOUS

## 2015-05-14 MED ORDER — SODIUM CHLORIDE 0.9 % IV SOLN
25.0000 ug/h | INTRAVENOUS | Status: DC
  Filled 2015-05-14: qty 50

## 2015-05-14 MED ORDER — AMIODARONE HCL 150 MG/3ML IV SOLN
INTRAVENOUS | Status: DC | PRN
Start: 2015-05-14 — End: 2015-05-14
  Administered 2015-05-14 (×2): 150 mg via INTRAVENOUS

## 2015-05-14 MED ORDER — NOREPINEPHRINE BITARTRATE 1 MG/ML IV SOLN: 4.0000 mg | INTRAVENOUS | Status: DC | PRN

## 2015-05-14 MED ORDER — ETOMIDATE 2 MG/ML IV SOLN
INTRAVENOUS | Status: DC | PRN
  Administered 2015-05-13: 25 mg via INTRAVENOUS

## 2015-05-14 MED ORDER — FENTANYL CITRATE (PF) 100 MCG/2ML IJ SOLN: 50.0000 ug | Freq: Once | INTRAMUSCULAR | Status: DC

## 2015-05-14 MED ORDER — STERILE WATER FOR INJECTION IV SOLN
INTRAVENOUS | Status: DC | PRN
  Administered 2015-05-14: 01:00:00 via INTRAVENOUS

## 2015-05-14 MED ORDER — EPINEPHRINE HCL 1 MG/ML IJ SOLN
INTRAMUSCULAR | Status: DC | PRN
  Administered 2015-05-14: 1 mg via SUBCUTANEOUS
  Administered 2015-05-14 (×4): 1 mg via INTRAVENOUS

## 2015-05-14 MED ORDER — ATROPINE SULFATE 0.1 MG/ML IJ SOLN
INTRAMUSCULAR | Status: AC
  Filled 2015-05-14: qty 10

## 2015-05-14 MED ORDER — LIDOCAINE HCL (PF) 1 % IJ SOLN
INTRAMUSCULAR | Status: AC
  Filled 2015-05-14: qty 30

## 2015-05-14 MED ORDER — HEPARIN SODIUM (PORCINE) 1000 UNIT/ML IJ SOLN
INTRAMUSCULAR | Status: DC | PRN
  Administered 2015-05-14: 2500 [IU] via INTRAVENOUS
  Administered 2015-05-14: 10000 [IU] via INTRAVENOUS
  Administered 2015-05-14: 2500 [IU] via INTRAVENOUS

## 2015-05-14 MED ORDER — HEPARIN SODIUM (PORCINE) 1000 UNIT/ML IJ SOLN
INTRAMUSCULAR | Status: AC
  Filled 2015-05-14: qty 1

## 2015-05-14 MED ORDER — NOREPINEPHRINE BITARTRATE 1 MG/ML IV SOLN
INTRAVENOUS | Status: AC
  Filled 2015-05-14: qty 4

## 2015-05-14 MED ORDER — EPINEPHRINE HCL 0.1 MG/ML IJ SOSY
PREFILLED_SYRINGE | INTRAMUSCULAR | Status: DC | PRN
  Administered 2015-05-13 (×2): 1 mg via INTRAVENOUS

## 2015-05-14 MED ORDER — SODIUM BICARBONATE 8.4 % IV SOLN
INTRAVENOUS | Status: DC | PRN
  Administered 2015-05-13 (×2): 50 meq via INTRAVENOUS

## 2015-05-14 MED ORDER — NOREPINEPHRINE BITARTRATE 1 MG/ML IV SOLN
0.0000 ug/min | INTRAVENOUS | Status: DC
  Filled 2015-05-14: qty 16

## 2015-05-15 MED FILL — Medication: Qty: 1 | Status: AC

## 2015-05-16 ENCOUNTER — Encounter (HOSPITAL_COMMUNITY): Payer: Self-pay | Admitting: Interventional Cardiology

## 2015-05-16 LAB — POCT I-STAT 3, ART BLOOD GAS (G3+)
ACID-BASE DEFICIT: 16 mmol/L — AB (ref 0.0–2.0)
Bicarbonate: 13 mEq/L — ABNORMAL LOW (ref 20.0–24.0)
O2 SAT: 87 %
TCO2: 14 mmol/L (ref 0–100)
pCO2 arterial: 41.8 mmHg (ref 35.0–45.0)
pH, Arterial: 7.1 — CL (ref 7.350–7.450)
pO2, Arterial: 72 mmHg — ABNORMAL LOW (ref 80.0–100.0)

## 2015-05-16 LAB — POCT ACTIVATED CLOTTING TIME: Activated Clotting Time: 675 seconds

## 2015-05-16 MED FILL — Nitroglycerin IV Soln 100 MCG/ML in D5W: INTRA_ARTERIAL | Qty: 10 | Status: AC

## 2015-05-16 MED FILL — Heparin Sodium (Porcine) 2 Unit/ML in Sodium Chloride 0.9%: INTRAMUSCULAR | Qty: 1000 | Status: AC

## 2015-05-20 NOTE — ED Notes (Signed)
CPR initiated

## 2015-05-20 NOTE — Progress Notes (Signed)
Pt intubated 2015/05/24 at 2330. ETT placement confirmed by chest rise, CXR, positive color change on end tidal and visualization using the glidescope.

## 2015-05-20 NOTE — ED Notes (Signed)
Pulse check, CPR resumed.  

## 2015-05-20 NOTE — ED Notes (Signed)
CPR resumed 

## 2015-05-20 NOTE — ED Notes (Signed)
Garnette Czech: [phone] Mirna Mires: [phone]

## 2015-05-20 NOTE — H&P (Signed)
Patient ID: Debbie [name] MRN: [id number], DOB/AGE: 55-Sep-1962   Admit date: 05/15/2015   Primary Physician: Christ Kick, PA Primary Cardiologist: Dr. Everette Rank  Pt. Profile:  23F with DM, obesity, asthma, anxiety, depression, and CAD s/p anterior MI in 2013 s/p DES to ostial LAD who presents with STEMI and cardiogenic shock.   Problem List  Past Medical History  Diagnosis Date  . Diabetes mellitus   . Hypertension   . Depression   . Anxiety   . Asthma   . Coronary artery disease   . Obesity (BMI 30-39.9) 03/16/2013  . 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 Abdominal hysterectomy  2002  . Cervical laminectomy  2007  . Breast surgery      breast reduction 2002  . Coronary stent placement    . Knee surgery    . Left leg fracture    . Left heart catheterization with coronary angiogram N/A 01/15/2012    Procedure: LEFT HEART CATHETERIZATION WITH CORONARY ANGIOGRAM;  Surgeon: Corky Crafts, MD;  Location: [facility] CATH LAB;  Service: Cardiovascular;  Laterality: N/A;  . Percutaneous coronary stent intervention (pci-s) N/A 01/15/2012    Procedure: PERCUTANEOUS CORONARY STENT INTERVENTION (PCI-S);  Surgeon: Corky Crafts, MD;  Location: [facility] CATH LAB;  Service: Cardiovascular;  Laterality: N/A;  . Left heart catheterization with coronary angiogram N/A 10/24/2012    Procedure: LEFT HEART CATHETERIZATION WITH CORONARY ANGIOGRAM;  Surgeon: Corky Crafts, MD;  Location: [facility] CATH LAB;  Service: Cardiovascular;  Laterality: N/A;     Allergies  No Known Allergies  HPI  23F with DM, obesity, asthma, anxiety, depression, and CAD s/p anterior MI in 2013 s/p DES to ostial LAD who presents with STEMI and cardiogenic shock.   Shortl PTA  Ms. Hitsman had a syncopal event which prompted EMS to be called. No report of CP. Per daughter, the patient felt "funny" and asked for EMS to be called. On  arrival anterior STE were found on ECG and STEMI was called from the field. The patient was cool, clammy, and hypotensive. On arrival to the ER, systolic BP was measured in 40s.   Ms. Domke coded multiple times in the ER. She required defibrillation x 2 for VT. She was intubated but not sedated. She was ultimately stabilized on dopamine and epinephrine.   Home Medications  Prior to Admission medications   Medication Sig Start Date End Date Taking? Authorizing Provider  aspirin 81 MG chewable tablet Chew 81 mg by mouth daily.    Historical Provider, MD  citalopram (CELEXA) 20 MG tablet Take 20 mg by mouth at bedtime.    Historical Provider, MD  clindamycin (CLEOCIN) 300 MG capsule Take 1 capsule (300 mg total) by mouth 4 (four) times daily. X 7 days Patient not taking: Reported on 11/25/2014 05/04/14   Loren Racer, MD  doxycycline (VIBRAMYCIN) 100 MG capsule Take 1 capsule (100 mg total) by mouth 2 (two) times daily. 12/30/14   Fayrene Helper, PA-C  glipiZIDE-metformin (METAGLIP) 5-500 MG per tablet Take 2 tablets by mouth 2 (two) times daily before a meal. 03/17/13   Corky Crafts, MD  Ibuprofen-Famotidine 800-26.6 MG TABS Take 1 tablet by mouth 3 (three) times daily. 02/04/15   Teressa Lower, NP  lisinopril (PRINIVIL,ZESTRIL) 10 MG tablet Take 10 mg by mouth daily.    Historical Provider, MD  metoprolol tartrate (LOPRESSOR) 25 MG tablet Take 1 tablet (25 mg  total) by mouth 2 (two) times daily. 03/17/13   Corky Crafts, MD  metroNIDAZOLE (FLAGYL) 500 MG tablet Take 1 tablet (500 mg total) by mouth 2 (two) times daily. 12/30/14   Fayrene Helper, PA-C  Multiple Vitamins-Minerals (MULTIVITAMIN WITH MINERALS) tablet Take 1 tablet by mouth daily.    Historical Provider, MD  nitroGLYCERIN (NITROSTAT) 0.4 MG SL tablet Place 0.4 mg under the tongue every 5 (five) minutes x 2 doses as needed. For chest pain    Historical Provider, MD  nystatin (MYCOSTATIN/NYSTOP) 100000 UNIT/GM POWD Apply twice a  day to the groin area. Patient not taking: Reported on 11/25/2014 05/04/14   Loren Racer, MD  sertraline (ZOLOFT) 50 MG tablet Take 50 mg by mouth daily.    Historical Provider, MD  traZODone (DESYREL) 100 MG tablet Take 100 mg by mouth at bedtime.    Historical Provider, MD    Family History  No family history on file.  Social History  History   Social History  . Marital Status: Legally Separated    Spouse Name: N/A  . Number of Children: N/A  . Years of Education: N/A   Occupational History  . Not on file.   Social History Main Topics  . Smoking status: Current Every Day Smoker -- 1.00 packs/day for 30 years    Types: Cigarettes  . Smokeless tobacco: Not on file  . Alcohol Use: Yes     Comment: occ  . Drug Use: No  . Sexual Activity: Not on file   Other Topics Concern  . Not on file   Social History Narrative   Disability. Formerly worked as Lawyer.  4 children.  Lives wwith son and daughter.     Review of Systems Unable to perform  Physical Exam  Blood pressure 61/32, pulse 28, resp. rate 25, SpO2 90 %.  General: intubated Neuro: paralyzed, no responding to stimula HEENT: ETT  Neck: Supple without JVD. Lungs:  Resp regular and unlabored, CTA. Heart: Tachycardic, regular Abdomen: Soft, non-tender, non-distended, BS + x 4.  Extremities: No clubbing, cyanosis or edema. Pulses thready. Extremities cool and clammy.   Labs  Troponin [facility] of Care Test)  Recent Labs  2015/05/15 2356  TROPIPOC 0.11*   No results for input(s): CKTOTAL, CKMB, TROPONINI in the last 72 hours. Lab Results  Component Value Date   WBC 7.1 05/08/2015   HGB 14.4 05/03/2015   HCT 42.9 05/13/2015   MCV 97.7 04/23/2015   PLT 94* 05/13/2015    Recent Labs Lab 05/02/2015 0006  NA 133*  K 3.8  CL 99*  CO2 13*  BUN 14  CREATININE 1.12*  CALCIUM 7.3*  PROT 5.0*  BILITOT 0.9  ALKPHOS 56  ALT 457*  AST 614*  GLUCOSE 672*   Lab Results  Component Value Date   CHOL 215*  03/17/2013   HDL 29* 03/17/2013   LDLCALC UNABLE TO CALCULATE IF TRIGLYCERIDE OVER 400 mg/dL 19/14/7829   TRIG 562* 03/17/2013   No results found for: DDIMER   Radiology/Studies  Dg Chest Portable 1 View  04/25/2015   CLINICAL DATA:  Code STEMI  EXAM: PORTABLE CHEST - 1 VIEW  COMPARISON:  02/06/1939 thin 16  FINDINGS: Endotracheal tube terminates 5 cm above the carina.  Lungs are clear.  No pleural effusion or pneumothorax.  Cardiomegaly.  Enteric tube courses below the diaphragm.  Defibrillator pads overlying the left hemithorax.  IMPRESSION: Endotracheal tube terminates 5 cm above the carina.  No evidence of acute cardiopulmonary disease.  Electronically Signed   By: Charline Bills M.D.   On: 05-30-2015 00:08     10/25/12 cath HEMODYNAMICS: Aortic pressure was 123/71; LV pressure was 117/15; LVEDP 24. There was no gradient between the left ventricle and aorta.   ANGIOGRAPHIC DATA: The left main coronary artery is a long vessel that is widely patent.  The left anterior descending artery is a large vessel to the apex. The proximal stent is widely patent. There is mild atherosclerosis in the mid LAD. There is some proximal in stent restenosis, up to 30% at the ostium. The D1 is small with a proximal 50% lesion.  The left circumflex artery is a large vessel. There is a small OM1 and a large OM2, both of which were widely patent. There is a large ramus vessel which is angiographically normal.  The right coronary artery is a large dominant vessel which is widely patent. The PDA has mild proximal disease.  LEFT VENTRICULOGRAM: Left ventricular angiogram was done in the 30 RAO projection and revealed a small area of apical akinesis and overall normal systolic function with an estimated ejection fraction of 55%. LVEDP was 24 mmHg.  IMPRESSIONS:  1. Patent left main coronary artery. 2. Patent ostial left anterior descending artery stent with mild instent restenosis, up to  30%.  3. Patent left circumflex artery and its branches. 4. Patent right coronary artery. 5. Small area of apical akinesis. Overall normal left ventricular systolic function. LVEDP 24 mmHg. Ejection fraction 55%.   ECG  Wide RBBB like QRS with anterolateral STE with "tombstone" appearance  ASSESSMENT AND PLAN  53F with DM, obesity, asthma, anxiety, depression, and CAD s/p anterior MI in 2013 s/p DES to ostial LAD who presents with STEMI and cardiogenic shock. Case discussed with on call interventionalist, Dr. Katrinka Blazing. Ms. Zurlo has likely occluded her LAD stent and is demonstrating electrical and hemodynamic instability. She will go to the cath lab for salvage PCI with IABP support. Prognosis is poor.   Signed, Glori Luis, MD 05/30/15, 1:08 AM

## 2015-05-20 NOTE — ED Notes (Signed)
Cath lab ready, unable to transport pt at this time. Cath lab informed.

## 2015-05-20 NOTE — ED Notes (Signed)
Radiology called for stat chest XRAY.

## 2015-05-20 NOTE — ED Notes (Signed)
Pt shocked X2.

## 2015-05-20 NOTE — ED Notes (Signed)
Cath lab informed we are en route with pt.

## 2015-05-20 NOTE — Progress Notes (Signed)
Patient went to the morgue wearing one earring on either ear and two gold bracelets on her right wrist. The patients daughter took the rest home with her.

## 2015-05-20 DEATH — deceased

## 2015-05-25 ENCOUNTER — Telehealth: Payer: Self-pay | Admitting: Interventional Cardiology

## 2015-05-25 NOTE — Telephone Encounter (Signed)
[facility] Verdis PrimeHenry Smith signed,[facility]eral Home aware ready for pick up.

## 2015-05-26 ENCOUNTER — Telehealth: Payer: Self-pay | Admitting: Interventional Cardiology

## 2015-05-26 NOTE — Telephone Encounter (Signed)
D/C picked up.  °
